# Patient Record
Sex: Female | Born: 1990 | Race: White | Hispanic: No | Marital: Married | Smoking: Never smoker
Health system: Southern US, Community
[De-identification: ages and names within clinical notes are randomized; demographics above are authoritative.]

## PROBLEM LIST (undated history)

## (undated) ENCOUNTER — Inpatient Hospital Stay (HOSPITAL_COMMUNITY): Payer: Self-pay

## (undated) DIAGNOSIS — E669 Obesity, unspecified: Secondary | ICD-10-CM

## (undated) DIAGNOSIS — R1013 Epigastric pain: Secondary | ICD-10-CM

## (undated) DIAGNOSIS — Z789 Other specified health status: Secondary | ICD-10-CM

## (undated) HISTORY — DX: Obesity, unspecified: E66.9

## (undated) HISTORY — PX: OTHER SURGICAL HISTORY: SHX169

---
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
## 2009-10-29 ENCOUNTER — Telehealth: Payer: Self-pay | Admitting: *Deleted

## 2010-04-16 NOTE — Progress Notes (Signed)
Summary: triage  Phone Note Call from Patient Call back at [phone]   Caller: Patient Summary of Call: Pt has sore throat and wondering if she can be seen today? Initial call taken by: Clydell Hakim,  October 29, 2009 10:10 AM  Follow-up for Phone Call        sore throat x 3-4 days. taking ibu & sudafed. gargling at time. ibu does help. no appts left for today. does not want to wait until tomorrow.  states there is a UC near her in [place] that she can go to. told her that would be ok. she will go there Follow-up by: Golden Circle RN,  October 29, 2009 10:26 AM

## 2010-05-08 ENCOUNTER — Encounter: Payer: Self-pay | Admitting: *Deleted

## 2011-08-26 ENCOUNTER — Ambulatory Visit (INDEPENDENT_AMBULATORY_CARE_PROVIDER_SITE_OTHER): Payer: BC Managed Care – PPO | Admitting: Family Medicine

## 2011-08-26 ENCOUNTER — Encounter: Payer: Self-pay | Admitting: Family Medicine

## 2011-08-26 VITALS — Ht 64.25 in | Wt 235.7 lb

## 2011-08-26 NOTE — Progress Notes (Signed)
Medical Nutrition Therapy:  Appt start time: 1630 end time:  1730.  Assessment:  Primary concerns today: Weight management.  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    Usual eating pattern includes 3 meals and 2 snacks per day, although most food is consumed late in the day. Usual physical activity includes none.  Mychal started working out with a Systems analyst, but the trainer quit following an injury, so she is floundering somewhat in terms of exercise.  Right now she would like to focus first on better food choices.  Weight Watchers has worked for [facility] in the past; she lost ~30 lb, but it became too expensive to continue, and although she learned to count points, Ellene did not learn what makes a healthy diet.    Everyday foods include 20 oz diet green tea, fruit.  Avoided foods include tomatoes (disliked).   24-hr recall suggests intake of <1500 kcal: B (8 AM)-   1 c Cocoa Pebbles , 1/2 c 1% milk  Snk-    water L (12- PM)-  [place] restaurant:  3/4 c rice, 1 c chx, mushrooms, & onions in chili sauce, 12 oz Sprite Snk ( PM)-  none D (7:30 PM)-  4 bbq ribs, 1/4 c potato salad (takeout) Snk ( PM)-  1 large [employer] chocolate chip muffin, water  Progress Towards Goal(s):  In progress.   Nutritional Diagnosis:  NB-2.1 Physical inactivity As related to poor motivation.  As evidenced by no regular exercise.    Intervention:  Nutrition Education.  Monitoring/Evaluation:  Dietary intake, exercise, and body weight in 1 month.

## 2011-08-26 NOTE — Patient Instructions (Addendum)
-   Wt management principles:  1. Fairly even food distribution thru the day (3 meals & 1-2 snacks/day; no more than 5 hrs between eating).    2. Obtain more low-calorie-dense foods (fruits and especially vegetables).    3. Solid calories (vs. liquid) help your brain to feel more satisfied.   4. Another tenet of appetite management:  Limit refined carb's (sweets & white flour products), and especially before lunch.    5. Protein with each meal helps to stabilize blood sugar (thereby keeping appetite in control).  May be especially important for breakfast.    Protein:  Meat, fish, eggs, poultry, beans (also have carb, mostly fiber), dairy foods, soy foods.    6. Daily physical activity, which includes working out (?5 X wk?), and just as important, moving throughout your day.     - Goals:   1. Protein with each meal.  2. Walk at least 30 min 5 X wk.   3. Limit sweets to no more than 3 X wk.   - Complete goals sheet provided today.  - Google "Lafonda Mosses and Sugar," and watch videos. - Write the reasons you want to lose weight.  Bring to follow-up appt in 3 weeks.   - AT FOLLOW-UP, WE'LL TALK MORE ABOUT INCORPORATING MORE VEG'S TO MEALS.

## 2011-09-16 ENCOUNTER — Ambulatory Visit: Payer: BC Managed Care – PPO | Admitting: Family Medicine

## 2012-01-28 ENCOUNTER — Ambulatory Visit: Payer: BC Managed Care – PPO | Admitting: Family Medicine

## 2012-01-29 ENCOUNTER — Ambulatory Visit: Payer: BC Managed Care – PPO | Admitting: Family Medicine

## 2012-02-11 ENCOUNTER — Ambulatory Visit (INDEPENDENT_AMBULATORY_CARE_PROVIDER_SITE_OTHER): Payer: BC Managed Care – PPO | Admitting: Family Medicine

## 2012-02-11 ENCOUNTER — Other Ambulatory Visit (HOSPITAL_COMMUNITY)
Admission: RE | Admit: 2012-02-11 | Discharge: 2012-02-11 | Disposition: A | Discharge status: Home / Self Care | Payer: BC Managed Care – PPO | Source: Ambulatory Visit | Attending: Family Medicine | Admitting: Family Medicine

## 2012-02-11 ENCOUNTER — Encounter: Payer: Self-pay | Admitting: Family Medicine

## 2012-02-11 VITALS — BP 133/83 | HR 93 | Temp 98.4°F | Ht 64.5 in | Wt 232.0 lb

## 2012-02-11 DIAGNOSIS — Z01419 Encounter for gynecological examination (general) (routine) without abnormal findings: Secondary | ICD-10-CM

## 2012-02-11 DIAGNOSIS — Z309 Encounter for contraceptive management, unspecified: Secondary | ICD-10-CM | POA: Insufficient documentation

## 2012-02-11 DIAGNOSIS — Z23 Encounter for immunization: Secondary | ICD-10-CM

## 2012-02-11 DIAGNOSIS — Z113 Encounter for screening for infections with a predominantly sexual mode of transmission: Secondary | ICD-10-CM | POA: Insufficient documentation

## 2012-02-11 DIAGNOSIS — Z1151 Encounter for screening for human papillomavirus (HPV): Secondary | ICD-10-CM | POA: Insufficient documentation

## 2012-02-11 DIAGNOSIS — Z124 Encounter for screening for malignant neoplasm of cervix: Secondary | ICD-10-CM

## 2012-02-11 NOTE — Assessment & Plan Note (Signed)
Discussed options in detail. Would like to avoid depo due to weight gain.  Interested in [place].  Will research and let me know.  If she changes her mind, will be happy to prescribe OCP or nuvaring.

## 2012-02-11 NOTE — Patient Instructions (Addendum)
Recommendations: Flu shot TDAP Gardasil vaccination series Fasting labwork- cholesterol, blood sugar  Return for Mirena IUD [email or web address]

## 2012-02-11 NOTE — Progress Notes (Signed)
  Subjective:    Patient ID: Karen Mcintyre, female    DOB: [birth year], 21 y.o.   MRN: [id number]  HPInew patient here to establish primary care  Annual Gynecological Exam  G0P0 Wt Readings from Last 3 Encounters:  02/11/12 232 lb (105.235 kg)  08/26/11 235 lb 11.2 oz (106.913 kg)  12/23/06 187 lb 4.8 oz (84.959 kg) (96.68%*)   * Growth percentiles are based on CDC 2-20 Years data.   Last period: 01/29/2012 Regular periods: yes Heavy bleeding: no  Sexually active: yes Birth control or hormonal therapy: has been on depo.  Hx of STD: Patient desires STD screening Dyspareunia: No Vaginal discharge: No Dysuria: NO  Last mammogram:No Breast mass or concerns: No Last Pap: None History of abnormal pap: No  FH of breast, uterine, ovarian, colon cancer: No   Patient Information Form: Screening and ROS  AUDIT-C Score: 0 Do you feel safe in relationships? yes PHQ-2:negative  Review of Symptoms  General:  Negative for nexplained weight loss, fever Skin: Negative for new or changing mole, sore that won't heal HEENT: Negative for trouble hearing, trouble seeing, ringing in ears, mouth sores, hoarseness, change in voice, dysphagia. CV:  Negative for chest pain, dyspnea, edema, palpitations Resp: Negative for cough, dyspnea, hemoptysis GI: Negative for nausea, vomiting, diarrhea, constipation, abdominal pain, melena, hematochezia. GU: Negative for dysuria, incontinence, urinary hesitance, hematuria, vaginal or penile discharge, polyuria, sexual difficulty, lumps in testicle or breasts MSK: Negative for muscle cramps or aches, joint pain or swelling Neuro: Negative for headaches, weakness, numbness, dizziness, passing out/fainting Psych: Negative for depression, anxiety, memory problems  Positive for nausea  I have reviewed patient's  PMH, FH, and Social history and Medications as related to this visit.  Review of Systems See HPi    Objective:   Physical Exam GEN: Alert &  Oriented, No acute distress CV:  Regular Rate & Rhythm, no murmur Respiratory:  Normal work of breathing, CTAB Abd:  + BS, soft, no tenderness to palpation Ext: no pre-tibial edema Pelvic Exam:        External: normal female genitalia without lesions or masses        Vagina: normal without lesions or masses        Cervix: normal without lesions or masses        Adnexa: normal bimanual exam without masses or fullness        Uterus: normal by palpation        Pap smear: performed         Assessment & Plan:  Given flu and tdap today.  No gardasil documented.  Patient will check to see if she has record of this series being given as state vaccination record does not appear complete.

## 2012-02-23 ENCOUNTER — Encounter: Payer: Self-pay | Admitting: Family Medicine

## 2012-02-23 DIAGNOSIS — IMO0001 Reserved for inherently not codable concepts without codable children: Secondary | ICD-10-CM | POA: Insufficient documentation

## 2012-02-23 DIAGNOSIS — R896 Abnormal cytological findings in specimens from other organs, systems and tissues: Secondary | ICD-10-CM

## 2012-06-08 ENCOUNTER — Ambulatory Visit (INDEPENDENT_AMBULATORY_CARE_PROVIDER_SITE_OTHER): Payer: BC Managed Care – PPO | Admitting: Internal Medicine

## 2012-06-08 VITALS — BP 122/96 | HR 62 | Temp 98.5°F | Resp 16 | Ht 64.25 in | Wt 237.0 lb

## 2012-06-08 DIAGNOSIS — R059 Cough, unspecified: Secondary | ICD-10-CM

## 2012-06-08 DIAGNOSIS — R05 Cough: Secondary | ICD-10-CM

## 2012-06-08 DIAGNOSIS — J029 Acute pharyngitis, unspecified: Secondary | ICD-10-CM

## 2012-06-08 LAB — POCT RAPID STREP A (OFFICE): Rapid Strep A Screen: NEGATIVE

## 2012-06-08 MED ORDER — HYDROCODONE-ACETAMINOPHEN 7.5-325 MG/15ML PO SOLN: 5.0000 mL | Freq: Four times a day (QID) | ORAL | Status: DC | PRN

## 2012-06-08 MED ORDER — AZITHROMYCIN 250 MG PO TABS: ORAL_TABLET | ORAL | Status: DC

## 2012-06-08 NOTE — Progress Notes (Signed)
  Subjective:    Patient ID: Karen Mcintyre, female    DOB: 1991-01-28, 22 y.o.   MRN: [id number]  HPI 2days of st and congestion mild cough. No sob,cp, blood. Low grade fever   Review of Systems See list    Objective:   Physical Exam  Vitals reviewed. Constitutional: She is oriented to person, place, and time. She appears well-nourished. No distress.  HENT:  Right Ear: External ear normal.  Left Ear: External ear normal.  Mouth/Throat: Oropharyngeal exudate present.  Eyes: EOM are normal.  Cardiovascular: Normal rate, regular rhythm and normal heart sounds.   Pulmonary/Chest: Effort normal. She has wheezes.  Neurological: She is alert and oriented to person, place, and time. Coordination normal.  Skin: No rash noted.  Psychiatric: She has a normal mood and affect.   RST Results for orders placed in visit on 06/08/12  POCT RAPID STREP A (OFFICE)      Result Value Range   Rapid Strep A Screen Negative  Negative         Assessment & Plan:  Tonsillitis/cough

## 2012-06-08 NOTE — Patient Instructions (Addendum)
Tonsillitis Tonsils are lumps of lymphoid tissues at the back of the throat. Each tonsil has 20 crevices (crypts). Tonsils help fight nose and throat infections and keep infection from spreading to other parts of the body for the first 18 months of life. Tonsillitis is an infection of the throat that causes the tonsils to become red, tender, and swollen. CAUSES Sudden and, if treated, temporary (acute) tonsillitis is usually caused by infection with streptococcal bacteria. Long lasting (chronic) tonsillitis occurs when the crypts of the tonsils become filled with pieces of food and bacteria, which makes it easy for the tonsils to become constantly infected. SYMPTOMS  Symptoms of tonsillitis include:  A sore throat.  White patches on the tonsils.  Fever.  Tiredness. DIAGNOSIS Tonsillitis can be diagnosed through a physical exam. Diagnosis can be confirmed with the results of lab tests, including a throat culture. TREATMENT  The goals of tonsillitis treatment include the reduction of the severity and duration of symptoms, prevention of associated conditions, and prevention of disease transmission. Tonsillitis caused by bacteria can be treated with antibiotics. Usually, treatment with antibiotics is started before the cause of the tonsillitis is known. However, if it is determined that the cause is not bacterial, antibiotics will not treat the tonsillitis. If attacks of tonsillitis are severe and frequent, your caregiver may recommend surgery to remove the tonsils (tonsillectomy). HOME CARE INSTRUCTIONS   Rest as much as possible and get plenty of sleep.  Drink plenty of fluids. While the throat is very sore, eat soft foods or liquids, such as sherbet, soups, or instant breakfast drinks.  Eat frozen ice pops.  Older children and adults may gargle with a warm or cold liquid to help soothe the throat. Mix 1 teaspoon of salt in 1 cup of water.  Other family members who also develop a sore  throat or fever should have a medical exam or throat culture.  Only take over-the-counter or prescription medicines for pain, discomfort, or fever as directed by your caregiver.  If you are given antibiotics, take them as directed. Finish them even if you start to feel better. SEEK MEDICAL CARE IF:   Your baby is older than 3 months with a rectal temperature of 100.5 F (38.1 C) or higher for more than 1 day.  Large, tender lumps develop in your neck.  A rash develops.  Green, yellow-brown, or bloody substance is coughed up.  You are unable to swallow liquids or food for 24 hours.  Your child is unable to swallow food or liquids for 12 hours. SEEK IMMEDIATE MEDICAL CARE IF:   You develop any new symptoms such as vomiting, severe headache, stiff neck, chest pain, or trouble breathing or swallowing.  You have severe throat pain along with drooling or voice changes.  You have severe pain, unrelieved with recommended medications.  You are unable to fully open the mouth.  You develop redness, swelling, or severe pain anywhere in the neck.  You have a fever.  Your baby is older than 3 months with a rectal temperature of 102 F (38.9 C) or higher.  Your baby is 3 months old or younger with a rectal temperature of 100.4 F (38 C) or higher. MAKE SURE YOU:   Understand these instructions.  Will watch your condition.  Will get help right away if you are not doing well or get worse. Document Released: 12/11/2004 Document Revised: 05/26/2011 Document Reviewed: 05/09/2010 ExitCare Patient Information 2013 [employer].  

## 2012-07-08 DIAGNOSIS — Y92410 Unspecified street and highway as the place of occurrence of the external cause: Secondary | ICD-10-CM | POA: Insufficient documentation

## 2012-07-08 DIAGNOSIS — S6990XA Unspecified injury of unspecified wrist, hand and finger(s), initial encounter: Secondary | ICD-10-CM | POA: Insufficient documentation

## 2012-07-08 DIAGNOSIS — S59909A Unspecified injury of unspecified elbow, initial encounter: Secondary | ICD-10-CM | POA: Insufficient documentation

## 2012-07-08 DIAGNOSIS — Y9389 Activity, other specified: Secondary | ICD-10-CM | POA: Insufficient documentation

## 2012-07-08 DIAGNOSIS — S20219A Contusion of unspecified front wall of thorax, initial encounter: Secondary | ICD-10-CM | POA: Insufficient documentation

## 2012-07-08 DIAGNOSIS — S301XXA Contusion of abdominal wall, initial encounter: Secondary | ICD-10-CM | POA: Insufficient documentation
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
## 2012-07-09 ENCOUNTER — Emergency Department (HOSPITAL_BASED_OUTPATIENT_CLINIC_OR_DEPARTMENT_OTHER)
Admission: EM | Admit: 2012-07-09 | Discharge: 2012-07-09 | Disposition: A | Discharge status: Home / Self Care | Payer: BC Managed Care – PPO | Attending: Emergency Medicine | Admitting: Emergency Medicine

## 2012-07-09 ENCOUNTER — Encounter (HOSPITAL_BASED_OUTPATIENT_CLINIC_OR_DEPARTMENT_OTHER): Payer: Self-pay | Admitting: *Deleted

## 2012-07-09 MED ORDER — HYDROCODONE-ACETAMINOPHEN 5-325 MG PO TABS: 1.0000 | ORAL_TABLET | Freq: Four times a day (QID) | ORAL | Status: DC | PRN
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
## 2013-04-14 ENCOUNTER — Other Ambulatory Visit (INDEPENDENT_AMBULATORY_CARE_PROVIDER_SITE_OTHER): Payer: Self-pay | Admitting: General Surgery

## 2013-04-14 ENCOUNTER — Ambulatory Visit (INDEPENDENT_AMBULATORY_CARE_PROVIDER_SITE_OTHER): Payer: BC Managed Care – PPO | Admitting: General Surgery

## 2013-04-14 ENCOUNTER — Encounter (INDEPENDENT_AMBULATORY_CARE_PROVIDER_SITE_OTHER): Payer: Self-pay | Admitting: General Surgery

## 2013-04-14 VITALS — BP 128/80 | HR 83 | Temp 98.8°F | Resp 18 | Ht 63.5 in | Wt 258.0 lb

## 2013-04-14 DIAGNOSIS — Z6841 Body Mass Index (BMI) 40.0 and over, adult: Secondary | ICD-10-CM

## 2013-04-14 DIAGNOSIS — M25569 Pain in unspecified knee: Secondary | ICD-10-CM

## 2013-04-14 DIAGNOSIS — M545 Low back pain, unspecified: Secondary | ICD-10-CM

## 2013-04-14 NOTE — Patient Instructions (Signed)
We will start the pre-qualification evaluation. Please call me if you have any questions during the process. Please watch the EMMI video on gastric bypass.

## 2013-04-14 NOTE — Progress Notes (Signed)
Patient ID: Karen Mcintyre, female   DOB: 02-16-1991, 23 y.o.   MRN: [id number]  Chief Complaint  Patient presents with  . Bariatric Pre-op    RNY    HPI Karen Mcintyre is a 23 y.o. female.   HPI 23 yo morbidly obese WF referred by Dr Robert Bellow for evaluation of weight loss surgery.  The patient states that she has struggled with her weight ever since puberty. Despite numerous sustained attempts for weight loss she has been unsuccessful. She has tried Vickey Sages, [employer], physician directed weight loss, low calorie diet, Slim fast, as well as phentermine all without any long-term success. She would lose some weight but regained all the weight back plus some additional weight. She works as a Haematologist. She is interested in the gastric bypass because of the research she has done. She is not interested in the laparoscopic adjustable gastric band because of the frequency of followup visits as well as for insurance reasons.  She is accompanied by her mother who underwent a laparoscopic sleeve gastrectomy by me in the fall of 2014 History reviewed. No pertinent past medical history.  History reviewed. No pertinent past surgical history.  Family History  Problem Relation Age of Onset  . Hyperlipidemia Mother   . Alcohol abuse Father   . Cancer Paternal Grandmother     lung cancer    Social History History  Substance Use Topics  . Smoking status: Never Smoker   . Smokeless tobacco: Never Used  . Alcohol Use: No    Allergies  Allergen Reactions  . Diphenhydramine Hcl     REACTION: urticaria (hives)  . Nitrofurantoin     REACTION: unspecified    Current Outpatient Prescriptions  Medication Sig Dispense Refill  . azithromycin (ZITHROMAX) 250 MG tablet Use as directed  6 tablet  0  . HYDROcodone-acetaminophen (HYCET) 7.5-325 mg/15 ml solution Take 5 mLs by mouth every 6 (six) hours as needed for pain (or cough).  240 mL  0  . HYDROcodone-acetaminophen (NORCO/VICODIN) 5-325 MG per  tablet Take 1-2 tablets by mouth every 6 (six) hours as needed for pain.  20 tablet  0   No current facility-administered medications for this visit.    Review of Systems Review of Systems  Constitutional: Negative for fever, activity change, appetite change and unexpected weight change.  HENT: Negative for nosebleeds and trouble swallowing.   Eyes: Negative for photophobia and visual disturbance.  Respiratory: Negative for chest tightness and shortness of breath.   Cardiovascular: Negative for chest pain and leg swelling.       Denies CP, SOB, orthopnea, PND, DOE  Gastrointestinal: Negative for nausea, vomiting, abdominal pain, diarrhea and constipation.       Denies reflux symptoms; takes probiotic daily  Genitourinary: Negative for dysuria and difficulty urinating.       Periods usually 3 days, regular flow; past couple of periods spotting then several days of heavy flow. Currently no birth control. No pregnancies  Musculoskeletal: Positive for back pain. Negative for arthralgias.       Low back pain; by end of day has b/l knee pain  Skin: Negative for pallor and rash.  Neurological: Negative for dizziness, seizures, facial asymmetry and numbness.          Hematological: Negative for adenopathy. Does not bruise/bleed easily.  Psychiatric/Behavioral: Negative for behavioral problems and agitation.    Blood pressure 128/80, pulse 83, temperature 98.8 F (37.1 C), resp. rate 18, height 5' 3.5" (1.613 m),  weight 258 lb (117.028 kg).  Physical Exam Physical Exam  Vitals reviewed. Constitutional: She is oriented to person, place, and time. She appears well-developed and well-nourished. No distress.  Morbid obesity - evenly distributed  HENT:  Head: Normocephalic and atraumatic.  Right Ear: External ear normal.  Left Ear: External ear normal.  Eyes: Conjunctivae are normal. No scleral icterus.  Neck: Normal range of motion. Neck supple. No tracheal deviation present. No  thyromegaly present.  Cardiovascular: Normal rate and normal heart sounds.   Pulmonary/Chest: Effort normal and breath sounds normal. No stridor. No respiratory distress. She has no wheezes.  Abdominal: Soft. Bowel sounds are normal. She exhibits no distension. There is no tenderness. There is no rebound and no guarding.  Musculoskeletal: She exhibits no edema and no tenderness.  Lymphadenopathy:    She has no cervical adenopathy.  Neurological: She is alert and oriented to person, place, and time. She exhibits normal muscle tone.  Skin: Skin is warm and dry. No rash noted. She is not diaphoretic. No erythema.  Psychiatric: She has a normal mood and affect. Her behavior is normal. Judgment and thought content normal.    Data Reviewed Dr Perrin MalteseGuest note  Assessment    Morbid obesity BMI 44.99 Low back pain B/l Knee pain     Plan    The patient meets weight loss surgery criteria. I think the patient would be an acceptable candidate for Laparoscopic Roux-en-Y Gastric bypass.   We discussed laparoscopic Roux-en-Y gastric bypass. We discussed the preoperative, operative and postoperative process. Using diagrams, I explained the surgery in detail including the performance of an EGD near the end of the surgery and an Upper GI swallow study on POD 1. We discussed the typical hospital course including a 2-3 day stay baring any complications.   The patient was given educational material. I quoted the patient that they can expect to lose 50-70% of their excess weight with the gastric bypass. We did discuss the possibility of weight regain several years after the procedure.  We discussed the risk and benefits of surgery including but not limited to anesthesia risk, bleeding, infection, anastomotic edema requiring a few additional days in the hospital, postop nausea, possible conversion to open procedure, blood clot formation, anastomotic leak, anastomotic stricture, ulcer formation, death, respiratory  complications, intestinal blockage, internal hernia, gallstone formation, vitamin and nutritional deficiencies, hair loss, weight regain injury to surrounding structures, failure to lose weight and mood changes.  We discussed that before and after surgery that there would be an alteration in their diet. I explained that we have put them on a diet 2 weeks before surgery. I also explained that they would be on a liquid diet for 2 weeks after surgery. We discussed that they would have to avoid certain foods such as sugar after surgery. We discussed the importance of physical activity as well as compliance with our dietary and supplement recommendations and routine follow-up.  I explained to the patient that we will start our evaluation process which includes labs, Upper GI to evaluate stomach and swallowing anatomy, nutritionist consultation, psychiatrist consultation, EKG, CXR, abdominal ultrasound. She was also given access to watch EMMI video on RYGB.  Mary SellaEric M. [name], MD, FACS General, Bariatric, & Minimally Invasive Surgery [facility], [name]          [facility] Associates LLCWILSON,Hutchinson Isenberg M 04/14/2013, 11:42 AM

## 2013-04-18 ENCOUNTER — Other Ambulatory Visit (INDEPENDENT_AMBULATORY_CARE_PROVIDER_SITE_OTHER): Payer: Self-pay

## 2013-04-19 ENCOUNTER — Ambulatory Visit (INDEPENDENT_AMBULATORY_CARE_PROVIDER_SITE_OTHER): Payer: BC Managed Care – PPO | Admitting: Family Medicine

## 2013-04-19 ENCOUNTER — Encounter: Payer: Self-pay | Admitting: Family Medicine

## 2013-04-19 VITALS — BP 122/78 | HR 92 | Temp 98.3°F | Ht 63.0 in | Wt 258.6 lb

## 2013-04-19 NOTE — Patient Instructions (Signed)
It was nice seeing Patent attorneyyou Velvet, I will work on you paper work for your surgery and fax it to Personal assistantthe surgeon. You do have abnormal PAP which requires follow up, please schedule repeat PAP in Nov of this year.

## 2013-04-19 NOTE — Progress Notes (Signed)
Subjective:     Patient ID: Karen Mcintyre, female   DOB: 08/21/1990, 23 y.o.   MRN: [id number]  HPI Obesity: Here to follow up with her weight,she has had issues with this since puberty,she has tried different options to lose weight with no success,this has become bothersome to her, at work her leg swells up when she sits for long,she also feel so tired most time during the day.She was recently evaluated for gastric bypass and would like to complete form for medical necessity. Abnormal PAP: She had ASCUS in 2013,here for follow up. Denies any other GU concern. No current outpatient prescriptions on file prior to visit.   No current facility-administered medications on file prior to visit.   Past Medical History  Diagnosis Date  . Obesity       Review of Systems  Constitutional: Positive for unexpected weight change.  Respiratory: Negative.   Cardiovascular: Negative.   Gastrointestinal: Negative.   Genitourinary: Negative.   All other systems reviewed and are negative.   Filed Vitals:   04/19/13 0845  BP: 122/78  Pulse: 92  Temp: 98.3 F (36.8 C)  TempSrc: Oral  Height: 5\' 3"  (1.6 m)  Weight: 258 lb 9.6 oz (117.3 kg)       Objective:   Physical Exam  Nursing note and vitals reviewed. Constitutional: She appears well-developed. No distress.  Obese  Cardiovascular: Normal rate, regular rhythm, normal heart sounds and intact distal pulses.   No murmur heard. Pulmonary/Chest: Effort normal and breath sounds normal. No respiratory distress. She has no wheezes.  Abdominal: Soft. Bowel sounds are normal. She exhibits no distension and no mass. There is no tenderness.  Musculoskeletal: Normal range of motion. She exhibits no edema.  Neurological: She is alert.       Assessment:     Obesity Abnormal PAP: ASCUS     Plan:     Check problem list

## 2013-04-19 NOTE — Assessment & Plan Note (Signed)
Patient had neg HPV hence can return to routine screening. Last abnormal PAP was in nov 2013. Plan to recheck in Nov 2015. Patient instructed to schedule f/u in 9 months. She agreed with plan and verbalized understanding.

## 2013-04-19 NOTE — Assessment & Plan Note (Signed)
Patient has not been able to lose weight despite trying. This is affecting her daily activities. Patient qualifies for bariatric surgery with her BMI and symptoms. I completed her paper work and placed in front office to be mailed to her and also faxed to [facility].

## 2013-05-03 ENCOUNTER — Ambulatory Visit (INDEPENDENT_AMBULATORY_CARE_PROVIDER_SITE_OTHER): Payer: BC Managed Care – PPO | Admitting: Internal Medicine

## 2013-05-03 VITALS — BP 108/80 | HR 66 | Temp 98.2°F | Resp 16 | Ht 63.5 in | Wt 258.0 lb

## 2013-05-03 DIAGNOSIS — H109 Unspecified conjunctivitis: Secondary | ICD-10-CM

## 2013-05-03 DIAGNOSIS — H1089 Other conjunctivitis: Secondary | ICD-10-CM

## 2013-05-03 MED ORDER — OFLOXACIN 0.3 % OP SOLN: 1.0000 [drp] | OPHTHALMIC | Status: DC

## 2013-05-03 NOTE — Patient Instructions (Signed)

## 2013-05-03 NOTE — Progress Notes (Signed)
   Subjective:    Patient ID: Karen Mcintyre, female    DOB: 09/05/1990, 23 y.o.   MRN: [id number]  HPI Patient presents here with 2-3 days of eye drainage.  Has had watery and itchy eyes.  Woke up with goopy eyes. Works at a [employer]. No other resp sxs.    Review of Systems     Objective:   Physical Exam  Constitutional: She is oriented to person, place, and time. She appears well-developed and well-nourished.  HENT:  Mouth/Throat: Oropharynx is clear and moist.  Eyes: EOM are normal. Pupils are equal, round, and reactive to light. Right eye exhibits no discharge and no exudate. Left eye exhibits discharge and exudate. Left eye exhibits no chemosis. No foreign body present in the left eye. Right conjunctiva is injected. Left conjunctiva is injected. No scleral icterus.  Neck: Normal range of motion.  Cardiovascular: Normal rate.   Pulmonary/Chest: Effort normal.  Musculoskeletal: Normal range of motion.  Lymphadenopathy:    She has no cervical adenopathy.  Neurological: She is alert and oriented to person, place, and time. She exhibits normal muscle tone. Coordination normal.  Psychiatric: She has a normal mood and affect.          Assessment & Plan:  Conjunctivitis Ocuflox gtts

## 2013-05-06 ENCOUNTER — Ambulatory Visit (HOSPITAL_COMMUNITY)
Admission: RE | Admit: 2013-05-06 | Discharge: 2013-05-06 | Disposition: A | Discharge status: Home / Self Care | Payer: BC Managed Care – PPO | Source: Ambulatory Visit | Attending: General Surgery | Admitting: General Surgery

## 2013-05-06 ENCOUNTER — Other Ambulatory Visit: Payer: Self-pay

## 2013-05-06 DIAGNOSIS — M25569 Pain in unspecified knee: Secondary | ICD-10-CM | POA: Insufficient documentation

## 2013-05-06 DIAGNOSIS — M545 Low back pain, unspecified: Secondary | ICD-10-CM | POA: Insufficient documentation

## 2013-05-06 DIAGNOSIS — K449 Diaphragmatic hernia without obstruction or gangrene: Secondary | ICD-10-CM | POA: Insufficient documentation

## 2013-05-06 DIAGNOSIS — Z6841 Body Mass Index (BMI) 40.0 and over, adult: Secondary | ICD-10-CM | POA: Insufficient documentation

## 2013-05-19 ENCOUNTER — Encounter: Payer: BC Managed Care – PPO | Attending: General Surgery | Admitting: Dietician

## 2013-05-19 ENCOUNTER — Encounter: Payer: Self-pay | Admitting: Dietician

## 2013-05-19 VITALS — Ht 63.5 in | Wt 261.1 lb

## 2013-05-19 DIAGNOSIS — Z713 Dietary counseling and surveillance: Secondary | ICD-10-CM | POA: Insufficient documentation

## 2013-05-19 LAB — CBC
HCT: 38.9 % (ref 36.0–46.0)
Hemoglobin: 13.4 g/dL (ref 12.0–15.0)
MCH: 29.3 pg (ref 26.0–34.0)
MCHC: 34.4 g/dL (ref 30.0–36.0)
MCV: 85.1 fL (ref 78.0–100.0)
PLATELETS: 369 10*3/uL (ref 150–400)
RBC: 4.57 MIL/uL (ref 3.87–5.11)
RDW: 13 % (ref 11.5–15.5)
WBC: 8.6 10*3/uL (ref 4.0–10.5)

## 2013-05-19 NOTE — Progress Notes (Signed)
  Pre-Op Assessment Visit:  Pre-Operative RYGB Surgery  Medical Nutrition Therapy:  Appt start time: 1100   End time:  1130.  Patient was seen on 05/19/2013 for Pre-Operative RYGB Nutrition Assessment. Assessment and [facility] on 05/19/2013.   Preferred Learning Style:  No preference indicated   Learning Readiness:   Ready  Handouts given during visit include:  Pre-Op Goals Bariatric Surgery Protein Shakes Bariatric Fast Food Guide  Teaching Method Utilized: Visual Auditory Hands on  Barriers to learning/adherence to lifestyle change: none  Demonstrated degree of understanding via:  Teach Back   Patient to call the [facility] to enroll in Pre-Op and Post-Op Nutrition Education when surgery date is scheduled.

## 2013-05-19 NOTE — Patient Instructions (Signed)
Patient to call the [facility] to enroll in Pre-Op and Post-Op Nutrition Education when surgery date is scheduled. 

## 2013-05-20 LAB — T4, FREE: Free T4: 1.03 ng/dL (ref 0.80–1.80)

## 2013-05-20 LAB — HCG, SERUM, QUALITATIVE: Preg, Serum: NEGATIVE

## 2013-05-20 LAB — TSH: TSH: 3.076 u[IU]/mL (ref 0.350–4.500)

## 2013-06-08 ENCOUNTER — Other Ambulatory Visit (INDEPENDENT_AMBULATORY_CARE_PROVIDER_SITE_OTHER): Payer: Self-pay | Admitting: General Surgery

## 2013-06-17 LAB — LIPID PANEL
CHOL/HDL RATIO: 3.7 ratio
CHOLESTEROL: 173 mg/dL (ref 0–200)
HDL: 47 mg/dL (ref >39)
LDL Cholesterol: 101 mg/dL — ABNORMAL HIGH (ref 0–99)
Triglycerides: 126 mg/dL (ref <150)
VLDL: 25 mg/dL (ref 0–40)

## 2013-06-22 ENCOUNTER — Other Ambulatory Visit (INDEPENDENT_AMBULATORY_CARE_PROVIDER_SITE_OTHER): Payer: Self-pay | Admitting: General Surgery

## 2013-06-27 ENCOUNTER — Encounter: Payer: BC Managed Care – PPO | Attending: General Surgery

## 2013-06-27 VITALS — Ht 63.5 in | Wt 263.5 lb

## 2013-06-27 DIAGNOSIS — Z713 Dietary counseling and surveillance: Secondary | ICD-10-CM | POA: Insufficient documentation

## 2013-06-29 NOTE — Progress Notes (Signed)
  Pre-Operative Nutrition Class:  Appt start time: 1102   End time:  1830.  Patient was seen on 06/27/2013 for Pre-Operative Bariatric Surgery Education at the [facility].   Surgery date: 08/01/13 Surgery type: RYGB Start weight at [facility]: 261 on 05/19/13 Weight today: 263.5 lbs  TANITA  BODY COMP RESULTS  06/27/13   BMI (kg/m^2) 45.9   Fat Mass (lbs) 134   Fat Free Mass (lbs) 129.5   Total Body Water (lbs) 95   Samples given per MNT protocol. Patient educated on appropriate usage:  Bariactiv Multivitamin (Qty 1) Lot #: [id number] Exp: 07/2014  Bariatric Advantage Calcium Citrate (cherry - Qty 1) Lot #: [id number] Exp: 12/2013  Premier protein shake (chocolate - Qty 1) Lot #: [id number] Exp: 03/2014  Renee Pain Protein Powder (unflavored - Qty 1) Lot #: [id number] Exp: 06/2014   The following the learning objectives were met by the patient during this course:  Identify Pre-Op Dietary Goals and will begin 2 weeks pre-operatively  Identify appropriate sources of fluids and proteins   State protein recommendations and appropriate sources pre and post-operatively  Identify Post-Operative Dietary Goals and will follow for 2 weeks post-operatively  Identify appropriate multivitamin and calcium sources  Describe the need for physical activity post-operatively and will follow MD recommendations  State when to call healthcare provider regarding medication questions or post-operative complications  Handouts given during class include:  Pre-Op Bariatric Surgery Diet Handout  Protein Shake Handout  Post-Op Bariatric Surgery Nutrition Handout  BELT Program Information Flyer  Support Group Information Flyer  [facility] Bariatric Supplements Price List  Follow-Up Plan: Patient will follow-up at [facility] 2 weeks post operatively for diet advancement per MD.

## 2013-07-11 ENCOUNTER — Other Ambulatory Visit (INDEPENDENT_AMBULATORY_CARE_PROVIDER_SITE_OTHER): Payer: Self-pay | Admitting: General Surgery

## 2013-07-12 ENCOUNTER — Other Ambulatory Visit (INDEPENDENT_AMBULATORY_CARE_PROVIDER_SITE_OTHER): Payer: Self-pay | Admitting: General Surgery

## 2013-07-12 LAB — COMPREHENSIVE METABOLIC PANEL
ALT: 15 U/L (ref 0–35)
AST: 14 U/L (ref 0–37)
Albumin: 4.6 g/dL (ref 3.5–5.2)
Alkaline Phosphatase: 67 U/L (ref 39–117)
BILIRUBIN TOTAL: 0.3 mg/dL (ref 0.2–1.2)
BUN: 12 mg/dL (ref 6–23)
CO2: 25 mEq/L (ref 19–32)
CREATININE: 0.63 mg/dL (ref 0.50–1.10)
Calcium: 8.8 mg/dL (ref 8.4–10.5)
Chloride: 106 mEq/L (ref 96–112)
Glucose, Bld: 92 mg/dL (ref 70–99)
Potassium: 4 mEq/L (ref 3.5–5.3)
SODIUM: 143 meq/L (ref 135–145)
Total Protein: 7.2 g/dL (ref 6.0–8.3)

## 2013-07-20 NOTE — Patient Instructions (Addendum)
Your procedure is scheduled on:  08/01/13 MONDAY  Report to [facility] at  08:20     AM.  Call this number if you have problems the morning of surgery: [phone]     BOWEL PREP AS PER OFFICE   Do not TAKE ANYTHING BY MOUTH After Midnight.SUNDAY NIGHT   Take these medicines the morning of surgery with A SIP OF WATER:NONE   .  Contacts, dentures or partial plates, or metal hairpins  can not be worn to surgery. Your family will be responsible for glasses, dentures, hearing aides while you are in surgery  Leave suitcase in the car. After surgery it may be brought to your room.  For patients admitted to the hospital, checkout time is 11:00 AM day of  discharge.                DO NOT WEAR JEWELRY, LOTIONS, POWDERS, OR PERFUMES.  WOMEN-- DO NOT SHAVE LEGS OR UNDERARMS FOR 48 HOURS BEFORE SHOWERS. MEN MAY SHAVE FACE.                                                                                                                                        [place] - Preparing for Surgery Before surgery, you can play an important role.  Because skin is not sterile, your skin needs to be as free of germs as possible.  You can reduce the number of germs on your skin by washing with CHG (chlorahexidine gluconate) soap before surgery.  CHG is an antiseptic cleaner which kills germs and bonds with the skin to continue killing germs even after washing. Please DO NOT use if you have an allergy to CHG or antibacterial soaps.  If your skin becomes reddened/irritated stop using the CHG and inform your nurse when you arrive at Short Stay. Do not shave (including legs and underarms) for at least 48 hours prior to the first CHG shower.  You may shave your face. Please follow these instructions carefully:  1.  Shower with CHG Soap the night before surgery and the  morning of Surgery.  2.  If you choose to wash your hair, wash your hair first as usual with your  normal  shampoo.  3.  After  you shampoo, rinse your hair and body thoroughly to remove the  shampoo.                           4.  Use CHG as you would any other liquid soap.  You can apply chg directly  to the skin and wash                       Gently with a scrungie or clean washcloth.  5.  Apply the CHG Soap to your body ONLY FROM THE NECK DOWN.  Do not use on open                           Wound or open sores. Avoid contact with eyes, ears mouth and genitals (private parts).                        Genitals (private parts) with your normal soap.             6.  Wash thoroughly, paying special attention to the area where your surgery  will be performed.  7.  Thoroughly rinse your body with warm water from the neck down.  8.  DO NOT shower/wash with your normal soap after using and rinsing off  the CHG Soap.                9.  Pat yourself dry with a clean towel.            10.  Wear clean pajamas.            11.  Place clean sheets on your bed the night of your first shower and do not  sleep with pets. Day of Surgery : Do not apply any lotions/deodorants the morning of surgery.  Please wear clean clothes to the hospital/surgery center.  FAILURE TO FOLLOW THESE INSTRUCTIONS MAY RESULT IN THE CANCELLATION OF YOUR SURGERY PATIENT SIGNATURE_________________________________  NURSE SIGNATURE__________________________________  ________________________________________________________________________    CLEAR LIQUID DIET   Foods Allowed                                                                     Foods Excluded  Coffee and tea, regular and decaf                             liquids that you cannot  Plain Jell-O in any flavor                                             see through such as: Fruit ices (not with fruit pulp)                                     milk, soups, orange juice  Iced Popsicles                                    All solid food Carbonated beverages, regular and diet                                     Cranberry, grape and apple juices Sports drinks like Gatorade Lightly seasoned clear broth or consume(fat free) Sugar, honey syrup  Sample Menu Breakfast  Lunch                                     Supper Cranberry juice                    Beef broth                            Chicken broth Jell-O                                     Grape juice                           Apple juice Coffee or tea                        Jell-O                                      Popsicle                                                Coffee or tea                        Coffee or tea  _____________________________________________________________________

## 2013-07-20 NOTE — Progress Notes (Signed)
cmet 07/11/13, ekg, chest 2/15 EPIC

## 2013-07-21 ENCOUNTER — Encounter (HOSPITAL_COMMUNITY): Payer: Self-pay | Admitting: Pharmacy Technician

## 2013-07-21 ENCOUNTER — Encounter (INDEPENDENT_AMBULATORY_CARE_PROVIDER_SITE_OTHER): Payer: Self-pay | Admitting: General Surgery

## 2013-07-21 ENCOUNTER — Ambulatory Visit (INDEPENDENT_AMBULATORY_CARE_PROVIDER_SITE_OTHER): Payer: BC Managed Care – PPO | Admitting: General Surgery

## 2013-07-21 VITALS — BP 130/80 | HR 98 | Temp 96.4°F | Resp 16 | Ht 63.5 in | Wt 263.8 lb

## 2013-07-21 MED ORDER — OXYCODONE HCL 5 MG/5ML PO SOLN: 5.0000 mg | ORAL | Status: DC | PRN

## 2013-07-21 NOTE — Patient Instructions (Signed)
Keep up with the preop diet  Congratulations on starting your journey to a healthier life! Over the next few weeks you will be undergoing tests (x-rays and labs) and seeing specialists to help evaluate you for weight loss surgery.  These tests and consultations with a psychologist and nutritionist are needed to prepare you for the lifestyle changes that lie ahead and are often required by insurance companies to approve you for surgery.   Pathway to Surgery:  Over the next few weeks -->Lab work -->Radiology tests   - Chest x-ray - make sure your lungs are normal before surgery  - Upper GI - you drink barium and pictures are taken as it travels down your  esophagus and into your stomach - looks for reflux and a hiatal hernia which may  need to repaired at the same time as your weight loss surgery  - Abdominal Ultrasound - looks at your gallbladder and liver  - Mammogram - up to date mammogram if you are a female -->EKG  -->Sleep study - if you are felt to be at high risk for obstructive sleep apnea -->H. Pylori breath test (BreathTek) - you surgeon may order this test to see if you have  a bacteria (H pylori) in your stomach which makes you at higher risk to develop a  ulcer or inflammation of your stomach -->Nutrition consultation -->Psychologist consultation -->Other specialist consults - your surgeon may determine that you need to see a  specialist like a cardiologist or pulmnologist depending on your health history -->Watch EMMI video about your planned weight loss surgery -->you can look at [email or web address] to learn more about weight loss surgery and  compare surgery outcomes -->you can look at our new website - [email or web address] - available mid-April 2015  Two weeks prior to surgery  Go on the extremely low carb liquid diet - this will decrease the size of your liver  which will make surgery safer - the nutritionist will go over this at a later date  Attend preoperative appointment  with your surgeon  Attend preoperative surgery class  One week prior to surgery  No aspirin products.  Tylenol is acceptable   24 hours prior to surgery  No alcoholic beverages  Report fever greater than 100.5 or excessive nasal drainage suggesting infection  Continue bariatric preop diet  Perform bowel prep if ordered  Do not eat or drink anything after midnight the night before surgery  Do not take any medications except those instructed by the anesthesiologist  Morning of surgery  Please arrive at the hospital at least 2 hours before your scheduled surgery time.  No makeup, fingernail polish or jewelry  Bring insurance cards with you  Bring your CPAP mask if you use this

## 2013-07-21 NOTE — Progress Notes (Signed)
Patient ID: Karen Mcintyre, female   DOB: 09/11/1990, 23 y.o.   MRN: [id number]  Chief Complaint  Patient presents with  . Bariatric Pre-op    HPI Karen Mcintyre is a 23 y.o. female.   HPI 23 year old morbidly obese Caucasian female comes in today for her preoperative appointment. She is currently scheduled to undergo laparoscopic Roux-en-Y gastric bypass on May 18. Her initial visit was on January 29 at which time her weight was 258 pounds.She denies any significant medical changes since she was initially seen. She has completed her preoperative workup and evaluation and has been approved for surgery Past Medical History  Diagnosis Date  . Obesity   . Obesity     History reviewed. No pertinent past surgical history.  Family History  Problem Relation Age of Onset  . Hyperlipidemia Mother   . Alcohol abuse Father   . Cancer Paternal Grandmother     lung cancer  . Obesity Other     Social History History  Substance Use Topics  . Smoking status: Never Smoker   . Smokeless tobacco: Never Used  . Alcohol Use: No    Allergies  Allergen Reactions  . Diphenhydramine Hcl     REACTION: urticaria (hives)  . Nitrofurantoin     REACTION: unspecified    Current Outpatient Prescriptions  Medication Sig Dispense Refill  . Multiple Vitamin (MULTIVITAMIN WITH MINERALS) TABS tablet Take 1 tablet by mouth daily.      . Multiple Vitamins-Minerals (HAIR/SKIN/NAILS PO) Take 1 tablet by mouth daily.       No current facility-administered medications for this visit.    Review of Systems Review of Systems  Constitutional: Negative for fever, activity change, appetite change and unexpected weight change.  HENT: Negative for nosebleeds and trouble swallowing.   Eyes: Negative for photophobia and visual disturbance.  Respiratory: Negative for chest tightness and shortness of breath.   Cardiovascular: Negative for chest pain and leg swelling.       Denies CP, SOB, orthopnea, PND, DOE   Gastrointestinal:       Denies gerd  Genitourinary: Negative for dysuria and difficulty urinating.  Musculoskeletal: Negative for arthralgias.  Skin: Negative for pallor and rash.  Neurological: Negative for dizziness, seizures, facial asymmetry and numbness.          Hematological: Negative for adenopathy. Does not bruise/bleed easily.  Psychiatric/Behavioral: Negative for behavioral problems and agitation.    There were no vitals taken for this visit.  Physical Exam Physical Exam  Vitals reviewed. Constitutional: She is oriented to person, place, and time. She appears well-developed and well-nourished. No distress.  Morbidly obese  HENT:  Head: Normocephalic and atraumatic.  Right Ear: External ear normal.  Left Ear: External ear normal.  Eyes: Conjunctivae are normal. No scleral icterus.  Neck: Normal range of motion. Neck supple. No tracheal deviation present. No thyromegaly present.  Cardiovascular: Normal rate and normal heart sounds.   Pulmonary/Chest: Effort normal and breath sounds normal. No stridor. No respiratory distress. She has no wheezes.  Abdominal: Soft. She exhibits no distension. There is no tenderness. There is no rebound and no guarding.  Musculoskeletal: She exhibits no edema and no tenderness.  Lymphadenopathy:    She has no cervical adenopathy.  Neurological: She is alert and oriented to person, place, and time. She exhibits normal muscle tone.  Skin: Skin is warm and dry. No rash noted. She is not diaphoretic. No erythema.  Psychiatric: She has a normal mood and affect. Her behavior  is normal. Judgment and thought content normal.    Data Reviewed My office note UGI - small hiatal hernia abd u/s - nml Screening labs - all wnl except LDL 101 was not fasting Nutrition note Psych note  Assessment    Morbid obesity BMI 46 Bilateral knee pain Low back pain     Plan    We reviewed her preoperative workup today. All of her questions were asked  and answered. We rediscussed the typical postoperative course and hospitalization. We discussed the importance of the preoperative diet. We discussed the upper GI finding of a small hiatal hernia. She has not had any GERD symptoms. I explained that we would test for hiatal hernia during surgery and possibly repair it if a clinically significant hernia was detected. We discussed potential hiatal hernia repair. She was given her prescription for her pain medication as well as her bowel prep instructions  Karen Mcintyre. Karen Mcintyre         Karen Mcintyre Karen Mcintyre 07/21/2013, 9:51 AM

## 2013-07-22 ENCOUNTER — Encounter (HOSPITAL_COMMUNITY)
Admission: RE | Admit: 2013-07-22 | Discharge: 2013-07-22 | Disposition: A | Discharge status: Home / Self Care | Payer: BC Managed Care – PPO | Source: Ambulatory Visit | Attending: General Surgery | Admitting: General Surgery

## 2013-07-22 ENCOUNTER — Encounter (HOSPITAL_COMMUNITY): Payer: Self-pay

## 2013-07-22 DIAGNOSIS — Z01812 Encounter for preprocedural laboratory examination: Secondary | ICD-10-CM | POA: Insufficient documentation

## 2013-07-22 LAB — CBC WITH DIFFERENTIAL/PLATELET
BASOS ABS: 0 10*3/uL (ref 0.0–0.1)
BASOS PCT: 0 % (ref 0–1)
EOS ABS: 0.1 10*3/uL (ref 0.0–0.7)
EOS PCT: 1 % (ref 0–5)
HEMATOCRIT: 38.1 % (ref 36.0–46.0)
Hemoglobin: 13.6 g/dL (ref 12.0–15.0)
Lymphocytes Relative: 24 % (ref 12–46)
Lymphs Abs: 2 10*3/uL (ref 0.7–4.0)
MCH: 29.9 pg (ref 26.0–34.0)
MCHC: 35.7 g/dL (ref 30.0–36.0)
MCV: 83.7 fL (ref 78.0–100.0)
MONO ABS: 0.6 10*3/uL (ref 0.1–1.0)
Monocytes Relative: 7 % (ref 3–12)
Neutro Abs: 5.7 10*3/uL (ref 1.7–7.7)
Neutrophils Relative %: 68 % (ref 43–77)
PLATELETS: 323 10*3/uL (ref 150–400)
RBC: 4.55 MIL/uL (ref 3.87–5.11)
RDW: 11.6 % (ref 11.5–15.5)
WBC: 8.4 10*3/uL (ref 4.0–10.5)

## 2013-08-01 ENCOUNTER — Inpatient Hospital Stay (HOSPITAL_COMMUNITY)
Admission: RE | Admit: 2013-08-01 | Discharge: 2013-08-03 | DRG: 621 | Disposition: A | Discharge status: Home / Self Care | Payer: BC Managed Care – PPO | Source: Ambulatory Visit | Attending: General Surgery | Admitting: General Surgery

## 2013-08-01 ENCOUNTER — Encounter (HOSPITAL_COMMUNITY): Payer: BC Managed Care – PPO | Admitting: Anesthesiology

## 2013-08-01 ENCOUNTER — Encounter (HOSPITAL_COMMUNITY): Payer: Self-pay | Admitting: *Deleted

## 2013-08-01 ENCOUNTER — Encounter (HOSPITAL_COMMUNITY)
Admission: RE | Disposition: A | Discharge status: Home / Self Care | Payer: Self-pay | Source: Ambulatory Visit | Attending: General Surgery

## 2013-08-01 ENCOUNTER — Inpatient Hospital Stay (HOSPITAL_COMMUNITY): Payer: BC Managed Care – PPO | Admitting: Anesthesiology

## 2013-08-01 DIAGNOSIS — M25569 Pain in unspecified knee: Secondary | ICD-10-CM

## 2013-08-01 DIAGNOSIS — Z6841 Body Mass Index (BMI) 40.0 and over, adult: Secondary | ICD-10-CM

## 2013-08-01 DIAGNOSIS — M545 Low back pain, unspecified: Secondary | ICD-10-CM

## 2013-08-01 DIAGNOSIS — R142 Eructation: Secondary | ICD-10-CM | POA: Diagnosis present

## 2013-08-01 DIAGNOSIS — K449 Diaphragmatic hernia without obstruction or gangrene: Secondary | ICD-10-CM | POA: Diagnosis present

## 2013-08-01 DIAGNOSIS — R141 Gas pain: Secondary | ICD-10-CM | POA: Diagnosis present

## 2013-08-01 DIAGNOSIS — R143 Flatulence: Secondary | ICD-10-CM

## 2013-08-01 DIAGNOSIS — Z01812 Encounter for preprocedural laboratory examination: Secondary | ICD-10-CM

## 2013-08-01 HISTORY — PX: GASTRIC ROUX-EN-Y: SHX5262

## 2013-08-01 LAB — HEMOGLOBIN AND HEMATOCRIT, BLOOD
HCT: 37.6 % (ref 36.0–46.0)
HEMOGLOBIN: 13.1 g/dL (ref 12.0–15.0)

## 2013-08-01 LAB — PREGNANCY, URINE: Preg Test, Ur: NEGATIVE

## 2013-08-01 SURGERY — LAPAROSCOPIC ROUX-EN-Y GASTRIC BYPASS WITH UPPER ENDOSCOPY
Anesthesia: General

## 2013-08-01 MED ORDER — CHLORHEXIDINE GLUCONATE CLOTH 2 % EX PADS: 6.0000 | MEDICATED_PAD | Freq: Once | CUTANEOUS | Status: DC

## 2013-08-01 MED ORDER — MORPHINE SULFATE 2 MG/ML IJ SOLN
2.0000 mg | INTRAMUSCULAR | Status: DC | PRN
  Administered 2013-08-01: 4 mg via INTRAVENOUS
  Administered 2013-08-01 – 2013-08-02 (×3): 2 mg via INTRAVENOUS
  Administered 2013-08-02 (×3): 4 mg via INTRAVENOUS
  Administered 2013-08-03: 2 mg via INTRAVENOUS
  Filled 2013-08-01 (×2): qty 2
  Filled 2013-08-01 (×3): qty 1
  Filled 2013-08-01: qty 2
  Filled 2013-08-01: qty 1
  Filled 2013-08-01: qty 2

## 2013-08-01 MED ORDER — ACETAMINOPHEN 10 MG/ML IV SOLN
1000.0000 mg | Freq: Once | INTRAVENOUS | Status: DC
Start: 2013-08-01 — End: 2013-08-01
  Filled 2013-08-01: qty 100

## 2013-08-01 MED ORDER — LACTATED RINGERS IV SOLN: INTRAVENOUS | Status: DC

## 2013-08-01 MED ORDER — OXYCODONE HCL 5 MG/5ML PO SOLN
5.0000 mg | ORAL | Status: DC | PRN
  Administered 2013-08-02: 5 mg via ORAL
  Filled 2013-08-01: qty 10

## 2013-08-01 MED ORDER — PROMETHAZINE HCL 25 MG/ML IJ SOLN
6.2500 mg | INTRAMUSCULAR | Status: DC | PRN
Start: 2013-08-01 — End: 2013-08-01
  Administered 2013-08-01: 6.25 mg via INTRAVENOUS

## 2013-08-01 MED ORDER — SODIUM CHLORIDE 0.9 % IJ SOLN
INTRAMUSCULAR | Status: AC
Start: 2013-08-01 — End: 2013-08-02
  Filled 2013-08-01: qty 10

## 2013-08-01 MED ORDER — KCL IN DEXTROSE-NACL 20-5-0.45 MEQ/L-%-% IV SOLN
INTRAVENOUS | Status: AC
  Filled 2013-08-01: qty 1000

## 2013-08-01 MED ORDER — SUCCINYLCHOLINE CHLORIDE 20 MG/ML IJ SOLN
INTRAMUSCULAR | Status: DC | PRN
  Administered 2013-08-01: 100 mg via INTRAVENOUS

## 2013-08-01 MED ORDER — EVICEL 5 ML EX KIT
PACK | CUTANEOUS | Status: DC | PRN
  Administered 2013-08-01: 2

## 2013-08-01 MED ORDER — BUPIVACAINE-EPINEPHRINE 0.25% -1:200000 IJ SOLN
INTRAMUSCULAR | Status: DC | PRN
  Administered 2013-08-01: 50 mL

## 2013-08-01 MED ORDER — SUFENTANIL CITRATE 50 MCG/ML IV SOLN
INTRAVENOUS | Status: DC | PRN
  Administered 2013-08-01: 10 ug via INTRAVENOUS
  Administered 2013-08-01 (×3): 5 ug via INTRAVENOUS
  Administered 2013-08-01 (×2): 10 ug via INTRAVENOUS
  Administered 2013-08-01: 5 ug via INTRAVENOUS

## 2013-08-01 MED ORDER — UNJURY VANILLA POWDER: 2.0000 [oz_av] | Freq: Four times a day (QID) | ORAL | Status: DC

## 2013-08-01 MED ORDER — CISATRACURIUM BESYLATE 20 MG/10ML IV SOLN
INTRAVENOUS | Status: AC
  Filled 2013-08-01: qty 10

## 2013-08-01 MED ORDER — ONDANSETRON HCL 4 MG/2ML IJ SOLN
INTRAMUSCULAR | Status: AC
  Filled 2013-08-01: qty 2

## 2013-08-01 MED ORDER — BUPIVACAINE-EPINEPHRINE 0.25% -1:200000 IJ SOLN
INTRAMUSCULAR | Status: AC
  Filled 2013-08-01: qty 1

## 2013-08-01 MED ORDER — MIDAZOLAM HCL 2 MG/2ML IJ SOLN
INTRAMUSCULAR | Status: AC
  Filled 2013-08-01: qty 2

## 2013-08-01 MED ORDER — FENTANYL CITRATE 0.05 MG/ML IJ SOLN
25.0000 ug | INTRAMUSCULAR | Status: DC | PRN
  Administered 2013-08-01: 50 ug via INTRAVENOUS
  Administered 2013-08-01: 25 ug via INTRAVENOUS

## 2013-08-01 MED ORDER — SODIUM CHLORIDE 0.9 % IJ SOLN
INTRAMUSCULAR | Status: AC
  Filled 2013-08-01: qty 10

## 2013-08-01 MED ORDER — CISATRACURIUM BESYLATE (PF) 10 MG/5ML IV SOLN
INTRAVENOUS | Status: DC | PRN
  Administered 2013-08-01: 16 mg via INTRAVENOUS
  Administered 2013-08-01 (×2): 4 mg via INTRAVENOUS

## 2013-08-01 MED ORDER — HEPARIN SODIUM (PORCINE) 5000 UNIT/ML IJ SOLN
5000.0000 [IU] | INTRAMUSCULAR | Status: AC
  Administered 2013-08-01: 5000 [IU] via SUBCUTANEOUS
  Filled 2013-08-01: qty 1

## 2013-08-01 MED ORDER — PROPOFOL 10 MG/ML IV BOLUS
INTRAVENOUS | Status: DC | PRN
  Administered 2013-08-01: 150 mg via INTRAVENOUS

## 2013-08-01 MED ORDER — MEPERIDINE HCL 50 MG/ML IJ SOLN: 6.2500 mg | INTRAMUSCULAR | Status: DC | PRN

## 2013-08-01 MED ORDER — TISSEEL VH 10 ML EX KIT
PACK | CUTANEOUS | Status: AC
  Filled 2013-08-01: qty 2

## 2013-08-01 MED ORDER — KCL IN DEXTROSE-NACL 20-5-0.45 MEQ/L-%-% IV SOLN
INTRAVENOUS | Status: DC
  Administered 2013-08-01: 1000 mL via INTRAVENOUS
  Administered 2013-08-02 – 2013-08-03 (×4): via INTRAVENOUS
  Filled 2013-08-01 (×9): qty 1000

## 2013-08-01 MED ORDER — CEFOXITIN SODIUM 2 G IV SOLR
2.0000 g | INTRAVENOUS | Status: AC
  Administered 2013-08-01: 2 g via INTRAVENOUS

## 2013-08-01 MED ORDER — PROMETHAZINE HCL 25 MG/ML IJ SOLN
INTRAMUSCULAR | Status: AC
Start: 2013-08-01 — End: 2013-08-02
  Filled 2013-08-01: qty 1

## 2013-08-01 MED ORDER — 0.9 % SODIUM CHLORIDE (POUR BTL) OPTIME
TOPICAL | Status: DC | PRN
  Administered 2013-08-01: 1000 mL

## 2013-08-01 MED ORDER — LIDOCAINE HCL (CARDIAC) 20 MG/ML IV SOLN
INTRAVENOUS | Status: DC | PRN
  Administered 2013-08-01: 100 mg via INTRAVENOUS

## 2013-08-01 MED ORDER — UNJURY CHICKEN SOUP POWDER: 2.0000 [oz_av] | Freq: Four times a day (QID) | ORAL | Status: DC

## 2013-08-01 MED ORDER — ACETAMINOPHEN 10 MG/ML IV SOLN
1000.0000 mg | Freq: Four times a day (QID) | INTRAVENOUS | Status: AC
  Administered 2013-08-01 – 2013-08-02 (×4): 1000 mg via INTRAVENOUS
  Filled 2013-08-01 (×4): qty 100

## 2013-08-01 MED ORDER — GLYCOPYRROLATE 0.2 MG/ML IJ SOLN
INTRAMUSCULAR | Status: DC | PRN
  Administered 2013-08-01: .6 mg via INTRAVENOUS

## 2013-08-01 MED ORDER — MIDAZOLAM HCL 5 MG/5ML IJ SOLN
INTRAMUSCULAR | Status: DC | PRN
  Administered 2013-08-01: 2 mg via INTRAVENOUS

## 2013-08-01 MED ORDER — ONDANSETRON HCL 4 MG/2ML IJ SOLN
INTRAMUSCULAR | Status: DC | PRN
  Administered 2013-08-01: 4 mg via INTRAVENOUS

## 2013-08-01 MED ORDER — FENTANYL CITRATE 0.05 MG/ML IJ SOLN
INTRAMUSCULAR | Status: AC
  Filled 2013-08-01: qty 2

## 2013-08-01 MED ORDER — NEOSTIGMINE METHYLSULFATE 10 MG/10ML IV SOLN
INTRAVENOUS | Status: DC | PRN
  Administered 2013-08-01: 5 mg via INTRAVENOUS

## 2013-08-01 MED ORDER — SUFENTANIL CITRATE 50 MCG/ML IV SOLN
INTRAVENOUS | Status: AC
  Filled 2013-08-01: qty 1

## 2013-08-01 MED ORDER — DEXTROSE 5 % IV SOLN
INTRAVENOUS | Status: AC
  Filled 2013-08-01: qty 2

## 2013-08-01 MED ORDER — ACETAMINOPHEN 10 MG/ML IV SOLN
INTRAVENOUS | Status: DC | PRN
  Administered 2013-08-01: 1000 mg via INTRAVENOUS

## 2013-08-01 MED ORDER — PROPOFOL 10 MG/ML IV BOLUS
INTRAVENOUS | Status: AC
  Filled 2013-08-01: qty 20

## 2013-08-01 MED ORDER — LACTATED RINGERS IR SOLN
Status: DC | PRN
  Administered 2013-08-01: 3000 mL

## 2013-08-01 MED ORDER — LACTATED RINGERS IV SOLN
INTRAVENOUS | Status: DC
  Administered 2013-08-01: 1000 mL via INTRAVENOUS
  Administered 2013-08-01: 12:00:00 via INTRAVENOUS

## 2013-08-01 MED ORDER — PANTOPRAZOLE SODIUM 40 MG IV SOLR
40.0000 mg | INTRAVENOUS | Status: DC
  Administered 2013-08-01 – 2013-08-02 (×2): 40 mg via INTRAVENOUS
  Filled 2013-08-01 (×3): qty 40

## 2013-08-01 MED ORDER — ENOXAPARIN SODIUM 40 MG/0.4ML ~~LOC~~ SOLN
40.0000 mg | Freq: Two times a day (BID) | SUBCUTANEOUS | Status: DC
  Administered 2013-08-02 – 2013-08-03 (×3): 40 mg via SUBCUTANEOUS
  Filled 2013-08-01 (×5): qty 0.4

## 2013-08-01 MED ORDER — ACETAMINOPHEN 160 MG/5ML PO SOLN: 325.0000 mg | ORAL | Status: DC | PRN

## 2013-08-01 MED ORDER — ACETAMINOPHEN 160 MG/5ML PO SOLN: 650.0000 mg | ORAL | Status: DC | PRN

## 2013-08-01 MED ORDER — UNJURY CHOCOLATE CLASSIC POWDER
2.0000 [oz_av] | Freq: Four times a day (QID) | ORAL | Status: DC
  Administered 2013-08-03: 2 [oz_av] via ORAL

## 2013-08-01 MED ORDER — ONDANSETRON HCL 4 MG/2ML IJ SOLN
4.0000 mg | INTRAMUSCULAR | Status: DC | PRN
  Administered 2013-08-01 – 2013-08-02 (×2): 4 mg via INTRAVENOUS
  Filled 2013-08-01 (×2): qty 2

## 2013-08-01 MED ORDER — LIDOCAINE HCL (CARDIAC) 20 MG/ML IV SOLN
INTRAVENOUS | Status: AC
  Filled 2013-08-01: qty 5

## 2013-08-01 SURGICAL SUPPLY — 61 items
ADH SKN CLS APL DERMABOND .7 (GAUZE/BANDAGES/DRESSINGS) ×2
APL SRG 32X5 SNPLK LF DISP (MISCELLANEOUS) ×2
APPLICATOR COTTON TIP 6IN STRL (MISCELLANEOUS) ×2 IMPLANT
BLADE SURG SZ11 CARB STEEL (BLADE) ×3 IMPLANT
CABLE HIGH FREQUENCY MONO STRZ (ELECTRODE) IMPLANT
CHLORAPREP W/TINT 26ML (MISCELLANEOUS) ×6 IMPLANT
CLIP SUT LAPRA TY ABSORB (SUTURE) ×6 IMPLANT
CUTTER LINEAR ENDO ART 45 ETS (STAPLE) ×3 IMPLANT
DERMABOND ADVANCED (GAUZE/BANDAGES/DRESSINGS) ×4
DERMABOND ADVANCED .7 DNX12 (GAUZE/BANDAGES/DRESSINGS) IMPLANT
DEVICE SUTURE ENDOST 10MM (ENDOMECHANICALS) ×3 IMPLANT
DRAIN PENROSE 18X1/4 LTX STRL (WOUND CARE) ×3 IMPLANT
DRAPE CAMERA CLOSED 9X96 (DRAPES) ×3 IMPLANT
ELECT REM PT RETURN 9FT ADLT (ELECTROSURGICAL) ×3
ELECTRODE REM PT RTRN 9FT ADLT (ELECTROSURGICAL) ×1 IMPLANT
GAUZE SPONGE 4X4 16PLY XRAY LF (GAUZE/BANDAGES/DRESSINGS) ×3 IMPLANT
GLOVE BIOGEL M STRL SZ7.5 (GLOVE) ×2 IMPLANT
GOWN STRL REUS W/TWL XL LVL3 (GOWN DISPOSABLE) ×14 IMPLANT
HOVERMATT SINGLE USE (MISCELLANEOUS) ×3 IMPLANT
KIT BASIN OR (CUSTOM PROCEDURE TRAY) ×3 IMPLANT
KIT GASTRIC LAVAGE 34FR ADT (SET/KITS/TRAYS/PACK) ×3 IMPLANT
MARKER SKIN DUAL TIP RULER LAB (MISCELLANEOUS) ×3 IMPLANT
NDL SPNL 22GX3.5 QUINCKE BK (NEEDLE) ×1 IMPLANT
NEEDLE SPNL 22GX3.5 QUINCKE BK (NEEDLE) ×3 IMPLANT
PACK CARDIOVASCULAR III (CUSTOM PROCEDURE TRAY) ×3 IMPLANT
RELOAD 45 VASCULAR/THIN (ENDOMECHANICALS) ×3 IMPLANT
RELOAD BLUE (STAPLE) ×9 IMPLANT
RELOAD ENDO STITCH 2.0 (ENDOMECHANICALS) ×30
RELOAD GOLD (STAPLE) ×3 IMPLANT
RELOAD STAPLE 45 2.5 WHT GRN (ENDOMECHANICALS) ×1 IMPLANT
RELOAD STAPLE 45 3.5 BLU ETS (ENDOMECHANICALS) ×1 IMPLANT
RELOAD STAPLE TA45 3.5 REG BLU (ENDOMECHANICALS) ×6 IMPLANT
RELOAD SUT SNGL STCH ABSRB 2-0 (ENDOMECHANICALS) ×4 IMPLANT
RELOAD WHITE ECR60W (STAPLE) ×3 IMPLANT
SCISSORS LAP 5X35 DISP (ENDOMECHANICALS) ×3 IMPLANT
SEALANT SURGICAL APPL DUAL CAN (MISCELLANEOUS) ×5 IMPLANT
SET IRRIG TUBING LAPAROSCOPIC (IRRIGATION / IRRIGATOR) ×3 IMPLANT
SHEARS HARMONIC ACE PLUS 45CM (MISCELLANEOUS) ×3 IMPLANT
SLEEVE ADV FIXATION 12X100MM (TROCAR) ×6 IMPLANT
SLEEVE XCEL OPT CAN 5 100 (ENDOMECHANICALS) ×3 IMPLANT
SOLUTION ANTI FOG 6CC (MISCELLANEOUS) ×3 IMPLANT
SPONGE GAUZE 4X4 12PLY (GAUZE/BANDAGES/DRESSINGS) IMPLANT
STAPLE ECHEON FLEX 60 POW ENDO (STAPLE) ×3 IMPLANT
STAPLER VISISTAT 35W (STAPLE) IMPLANT
SUT MNCRL AB 4-0 PS2 18 (SUTURE) ×5 IMPLANT
SUT RELOAD ENDO STITCH 2 48X1 (ENDOMECHANICALS) ×6
SUT RELOAD ENDO STITCH 2.0 (ENDOMECHANICALS) ×4
SUT VIC AB 2-0 SH 27 (SUTURE) ×3
SUT VIC AB 2-0 SH 27X BRD (SUTURE) ×1 IMPLANT
SUTURE RELOAD END STTCH 2 48X1 (ENDOMECHANICALS) ×6 IMPLANT
SUTURE RELOAD ENDO STITCH 2.0 (ENDOMECHANICALS) ×4 IMPLANT
SYR 20CC LL (SYRINGE) ×6 IMPLANT
TOWEL OR 17X26 10 PK STRL BLUE (TOWEL DISPOSABLE) ×3 IMPLANT
TOWEL OR NON WOVEN STRL DISP B (DISPOSABLE) ×3 IMPLANT
TRAY FOLEY CATH 14FRSI W/METER (CATHETERS) ×3 IMPLANT
TROCAR BLADELESS OPT 5 100 (ENDOMECHANICALS) ×3 IMPLANT
TROCAR UNIVERSAL OPT 12M 100M (ENDOMECHANICALS) ×9 IMPLANT
TROCAR XCEL 12X100 BLDLESS (ENDOMECHANICALS) ×3 IMPLANT
TUBING ENDO SMARTCAP (MISCELLANEOUS) ×2 IMPLANT
TUBING ENDO SMARTCAP PENTAX (MISCELLANEOUS) ×1 IMPLANT
TUBING FILTER THERMOFLATOR (ELECTROSURGICAL) ×3 IMPLANT

## 2013-08-01 NOTE — Anesthesia Postprocedure Evaluation (Signed)
  Anesthesia Post-op Note  Patient: [name]  Procedure(s) Performed: Procedure(s) (LRB): LAPAROSCOPIC ROUX-EN-Y GASTRIC BYPASS WITH UPPER ENDOSCOPY WITH LAPAROSCOPIC REPAIR OF HIATAL HERNIA (N/A)  Patient Location: PACU  Anesthesia Type: General  Level of Consciousness: awake and alert   Airway and Oxygen Therapy: Patient Spontanous Breathing  Post-op Pain: mild  Post-op Assessment: Post-op Vital signs reviewed, Patient's Cardiovascular Status Stable, Respiratory Function Stable, Patent Airway and No signs of Nausea or vomiting  Last Vitals:  Filed Vitals:   08/01/13 1515  BP: 120/81  Pulse: 74  Temp: 36.8 C  Resp: 16    Post-op Vital Signs: stable   Complications: No apparent anesthesia complications

## 2013-08-01 NOTE — Op Note (Signed)
Karen Mcintyre [id number] 02/19/1991. 08/01/2013  Preoperative diagnosis:  1. Morbid obesity (BMI 45)  2. Low back pain 3. Bilateral knee pain  Postoperative  diagnosis:  1. same  Surgical procedure: Laparoscopic Roux-en-Y gastric bypass (ante-colic, ante-gastric); upper endoscopy  Surgeon: Atilano InaEric M Michelle Wnek, M.D. FACS  Asst.: Luretha MurphyMatthew Martin, MD FACS  Anesthesia: General plus 0.25% marcaine with epi  Complications: None   EBL: Minimal   Drains: None   Disposition: PACU in good condition   Indications for procedure: 22yo WF with morbid obesity who has been unsuccessful at sustained weight loss. The patient's comorbidities are listed above. We discussed the risk and benefits of surgery including but not limited to anesthesia risk, bleeding, infection, blood clot formation, anastomotic leak, anastomotic stricture, ulcer formation, death, respiratory complications, intestinal blockage, internal hernia, gallstone formation, vitamin and nutritional deficiencies, injury to surrounding structures, failure to lose weight and mood changes.   Description of procedure: Patient is brought to the operating room and general anesthesia induced. The patient had received preoperative broad-spectrum IV antibiotics and subcutaneous heparin. The abdomen was widely sterilely prepped with Chloraprep and draped. Patient timeout was performed and correct patient and procedure confirmed. Access was obtained with a 12 mm Optiview trocar in the left upper quadrant and pneumoperitoneum established without difficulty. Under direct vision 12 mm trocars were placed laterally in the right upper quadrant, right upper quadrant midclavicular line, and to the left and above the umbilicus for the camera port. A 5 mm trocar was placed laterally in the left upper quadrant.  The omentum was brought into the upper abdomen and the transverse mesocolon elevated and the ligament of Treitz clearly identified. A 40 cm biliopancreatic limb  was then carefully measured from the ligament of Treitz. The small intestine was divided at this point with a single firing of the white load linear stapler. A Penrose drain was sutured to the end of the Roux-en-Y limb for later identification. A 100 cm Roux-en-Y limb was then carefully measured. At this point a side-to-side anastomosis was created between the Roux limb and the end of the biliopancreatic limb. This was accomplished with a single firing of the 45 mm white load linear stapler. The common enterotomy was closed with a running 2-0 Vicryl begun at either end of the enterotomy and tied centrally. Tisseel tissue sealant was placed over the anastomosis. The mesenteric defect was then closed with running 2-0 silk. The omentum was then divided with the harmonic scalpel up towards the transverse colon to allow mobility of the Roux limb toward the gastric pouch. The patient was then placed in steep reversed Trendelenburg. Through a 5 mm subxiphoid site the [facility] retractor was placed and the left lobe of the liver elevated with excellent exposure of the upper stomach and hiatus. The angle of Hiss was then mobilized with the harmonic scalpel. A 4 cm gastric pouch was then carefully measured along the lesser curve of the stomach. Dissection was carried along the lesser curve at this point with the Harmonic scalpel working carefully back toward the lesser sac at right angles to the lesser curve. The free lesser sac was then entered. After being sure all tubes were removed from the stomach an initial firing of the gold load 60 mm linear stapler was fired at right angles across the lesser curve for about 4 cm. The gastric pouch was further mobilized posteriorly and then the pouch was completed with 4 further firings of the 60 mm blue load linear stapler and 1 final  firing of a blue load 45mm linear stapler up through the previously dissected angle of His. It was ensured that the pouch was completely mobilized away  from the gastric remnant. This created a nice tubular 4-5 cm gastric pouch.  The Roux limb was then brought up in an antecolic fashion with the candycane facing to the patient's left without undue tension. The gastrojejunostomy was created with an initial posterior row of 2-0 Vicryl between the Roux limb and the staple line of the gastric pouch. Enterotomies were then made in the gastric pouch and the Roux limb with the harmonic scalpel and at approximately 2-2-1/2 cm anastomosis was created with a single firing of the 45mm blue load linear stapler. The staple line was inspected and was intact without bleeding. The common enterotomy was then closed with running 2-0 Vicryl begun at either end and tied centrally. The Ewall tube was then easily passed through the anastomosis and an outer anterior layer of running 2-0 Vicryl was placed. The Ewald tube was removed. With the outlet of the gastrojejunostomy clamped and under saline irrigation the assistant performed upper endoscopy and with the gastric pouch tensely distended with air-there was no evidence of leak on this test. The pouch was desufflated. The Vonita Mosseterson defect was closed with running 2-0 silk. The abdomen was inspected for any evidence of bleeding or bowel injury and everything looked fine. The Nathanson retractor was removed under direct vision after coating the anastomosis with Tisseel tissue sealant. All CO2 was evacuated and trochars removed. Skin incisions were closed with 4-0 monocryl in a subcuticular fashion followed by Dermabond. Sponge needle and instrument counts were correct. The patient was taken to the PACU in good condition.    Karen SellaEric M. [name], MD, FACS General, Bariatric, & Minimally Invasive Surgery [facility] [name]

## 2013-08-01 NOTE — Interval H&P Note (Signed)
History and Physical Interval Note:  08/01/2013 9:37 AM  Karen Mcintyre  has presented today for surgery, with the diagnosis of morbid obesity   The various methods of treatment have been discussed with the patient and family. After consideration of risks, benefits and other options for treatment, the patient has consented to  Procedure(s): LAPAROSCOPIC ROUX-EN-Y GASTRIC BYPASS WITH UPPER ENDOSCOPY WITH LAPAROSCOPIC REPAIR OF HIATAL HERNIA (N/A) as a surgical intervention .  The patient's history has been reviewed, patient examined, no change in status, stable for surgery.  I have reviewed the patient's chart and labs.  Questions were answered to the patient's satisfaction.    Karen SellaEric M. [name], MD, FACS General, Bariatric, & Minimally Invasive Surgery [facility] [name]   Karen Mcintyre

## 2013-08-01 NOTE — Anesthesia Preprocedure Evaluation (Addendum)
Anesthesia Evaluation  Patient identified by MRN, date of birth, ID band Patient awake    Reviewed: Allergy & Precautions, H&P , NPO status , Patient's Chart, lab work & pertinent test results  Airway Mallampati: II  TM Distance: >3 FB Neck ROM: Full    Dental no notable dental hx.    Pulmonary neg pulmonary ROS,  breath sounds clear to auscultation  Pulmonary exam normal       Cardiovascular negative cardio ROS  Rhythm:Regular Rate:Normal     Neuro/Psych negative neurological ROS  negative psych ROS   GI/Hepatic negative GI ROS, Neg liver ROS,   Endo/Other  Morbid obesity  Renal/GU negative Renal ROS  negative genitourinary   Musculoskeletal negative musculoskeletal ROS (+)   Abdominal   Peds negative pediatric ROS (+)  Hematology negative hematology ROS (+)   Anesthesia Other Findings   Reproductive/Obstetrics negative OB ROS                             Anesthesia Physical Anesthesia Plan  ASA: III  Anesthesia Plan: General   Post-op Pain Management:    Induction: Intravenous  Airway Management Planned: Oral ETT  Additional Equipment:   Intra-op Plan:   Post-operative Plan: Extubation in OR  Informed Consent: I have reviewed the patients History and Physical, chart, labs and discussed the procedure including the risks, benefits and alternatives for the proposed anesthesia with the patient or authorized representative who has indicated his/her understanding and acceptance.   Dental advisory given  Plan Discussed with: CRNA  Anesthesia Plan Comments:         Anesthesia Quick Evaluation  

## 2013-08-01 NOTE — Transfer of Care (Signed)
Immediate Anesthesia Transfer of Care Note  Patient: Karen Mcintyre  Procedure(s) Performed: Procedure(s): LAPAROSCOPIC ROUX-EN-Y GASTRIC BYPASS WITH UPPER ENDOSCOPY WITH LAPAROSCOPIC REPAIR OF HIATAL HERNIA (N/A)  Patient Location: PACU  Anesthesia Type:General  Level of Consciousness: awake, alert  and sedated  Airway & Oxygen Therapy: Patient Spontanous Breathing and Patient connected to face mask oxygen  Post-op Assessment: Report given to PACU RN and Post -op Vital signs reviewed and stable  Post vital signs: Reviewed and stable  Complications: No apparent anesthesia complications

## 2013-08-01 NOTE — H&P (View-Only) (Signed)
Patient ID: Karen Mcintyre, female   DOB: 06/23/1990, 22 y.o.   MRN: [id number]  Chief Complaint  Patient presents with  . Bariatric Pre-op    HPI Karen Mcintyre is a 22 y.o. female.   HPI 22-year-old morbidly obese Caucasian female comes in today for her preoperative appointment. She is currently scheduled to undergo laparoscopic Roux-en-Y gastric bypass on May 18. Her initial visit was on January 29 at which time her weight was 258 pounds.She denies any significant medical changes since she was initially seen. She has completed her preoperative workup and evaluation and has been approved for surgery Past Medical History  Diagnosis Date  . Obesity   . Obesity     History reviewed. No pertinent past surgical history.  Family History  Problem Relation Age of Onset  . Hyperlipidemia Mother   . Alcohol abuse Father   . Cancer Paternal Grandmother     lung cancer  . Obesity Other     Social History History  Substance Use Topics  . Smoking status: Never Smoker   . Smokeless tobacco: Never Used  . Alcohol Use: No    Allergies  Allergen Reactions  . Diphenhydramine Hcl     REACTION: urticaria (hives)  . Nitrofurantoin     REACTION: unspecified    Current Outpatient Prescriptions  Medication Sig Dispense Refill  . Multiple Vitamin (MULTIVITAMIN WITH MINERALS) TABS tablet Take 1 tablet by mouth daily.      . Multiple Vitamins-Minerals (HAIR/SKIN/NAILS PO) Take 1 tablet by mouth daily.       No current facility-administered medications for this visit.    Review of Systems Review of Systems  Constitutional: Negative for fever, activity change, appetite change and unexpected weight change.  HENT: Negative for nosebleeds and trouble swallowing.   Eyes: Negative for photophobia and visual disturbance.  Respiratory: Negative for chest tightness and shortness of breath.   Cardiovascular: Negative for chest pain and leg swelling.       Denies CP, SOB, orthopnea, PND, DOE   Gastrointestinal:       Denies gerd  Genitourinary: Negative for dysuria and difficulty urinating.  Musculoskeletal: Negative for arthralgias.  Skin: Negative for pallor and rash.  Neurological: Negative for dizziness, seizures, facial asymmetry and numbness.          Hematological: Negative for adenopathy. Does not bruise/bleed easily.  Psychiatric/Behavioral: Negative for behavioral problems and agitation.    There were no vitals taken for this visit.  Physical Exam Physical Exam  Vitals reviewed. Constitutional: She is oriented to person, place, and time. She appears well-developed and well-nourished. No distress.  Morbidly obese  HENT:  Head: Normocephalic and atraumatic.  Right Ear: External ear normal.  Left Ear: External ear normal.  Eyes: Conjunctivae are normal. No scleral icterus.  Neck: Normal range of motion. Neck supple. No tracheal deviation present. No thyromegaly present.  Cardiovascular: Normal rate and normal heart sounds.   Pulmonary/Chest: Effort normal and breath sounds normal. No stridor. No respiratory distress. She has no wheezes.  Abdominal: Soft. She exhibits no distension. There is no tenderness. There is no rebound and no guarding.  Musculoskeletal: She exhibits no edema and no tenderness.  Lymphadenopathy:    She has no cervical adenopathy.  Neurological: She is alert and oriented to person, place, and time. She exhibits normal muscle tone.  Skin: Skin is warm and dry. No rash noted. She is not diaphoretic. No erythema.  Psychiatric: She has a normal mood and affect. Her behavior   is normal. Judgment and thought content normal.    Data Reviewed My office note UGI - small hiatal hernia abd u/s - nml Screening labs - all wnl except LDL 101 was not fasting Nutrition note Psych note  Assessment    Morbid obesity BMI 46 Bilateral knee pain Low back pain     Plan    We reviewed her preoperative workup today. All of her questions were asked  and answered. We rediscussed the typical postoperative course and hospitalization. We discussed the importance of the preoperative diet. We discussed the upper GI finding of a small hiatal hernia. She has not had any GERD symptoms. I explained that we would test for hiatal hernia during surgery and possibly repair it if a clinically significant hernia was detected. We discussed potential hiatal hernia repair. She was given her prescription for her pain medication as well as her bowel prep instructions  Mary Sellaric Karen. [name], MD, FACS General, Bariatric, & Minimally Invasive Surgery [facility]         Karen Mcintyre 07/21/2013, 9:51 AM

## 2013-08-02 ENCOUNTER — Inpatient Hospital Stay (HOSPITAL_COMMUNITY): Payer: BC Managed Care – PPO

## 2013-08-02 ENCOUNTER — Encounter (HOSPITAL_COMMUNITY): Payer: Self-pay | Admitting: General Surgery

## 2013-08-02 LAB — COMPREHENSIVE METABOLIC PANEL
ALBUMIN: 3.3 g/dL — AB (ref 3.5–5.2)
ALK PHOS: 55 U/L (ref 39–117)
ALT: 33 U/L (ref 0–35)
AST: 32 U/L (ref 0–37)
BILIRUBIN TOTAL: 0.3 mg/dL (ref 0.3–1.2)
BUN: 5 mg/dL — ABNORMAL LOW (ref 6–23)
CHLORIDE: 102 meq/L (ref 96–112)
CO2: 25 mEq/L (ref 19–32)
Calcium: 8.5 mg/dL (ref 8.4–10.5)
Creatinine, Ser: 0.68 mg/dL (ref 0.50–1.10)
GFR calc Af Amer: >90 mL/min (ref >90)
GFR calc non Af Amer: >90 mL/min (ref >90)
Glucose, Bld: 116 mg/dL — ABNORMAL HIGH (ref 70–99)
POTASSIUM: 3.6 meq/L — AB (ref 3.7–5.3)
SODIUM: 139 meq/L (ref 137–147)
TOTAL PROTEIN: 6.4 g/dL (ref 6.0–8.3)

## 2013-08-02 LAB — CBC WITH DIFFERENTIAL/PLATELET
BASOS ABS: 0 10*3/uL (ref 0.0–0.1)
BASOS PCT: 0 % (ref 0–1)
EOS ABS: 0 10*3/uL (ref 0.0–0.7)
Eosinophils Relative: 0 % (ref 0–5)
HCT: 34.9 % — ABNORMAL LOW (ref 36.0–46.0)
Hemoglobin: 12.2 g/dL (ref 12.0–15.0)
Lymphocytes Relative: 20 % (ref 12–46)
Lymphs Abs: 2 10*3/uL (ref 0.7–4.0)
MCH: 28.8 pg (ref 26.0–34.0)
MCHC: 35 g/dL (ref 30.0–36.0)
MCV: 82.5 fL (ref 78.0–100.0)
Monocytes Absolute: 0.8 10*3/uL (ref 0.1–1.0)
Monocytes Relative: 8 % (ref 3–12)
NEUTROS ABS: 7.5 10*3/uL (ref 1.7–7.7)
NEUTROS PCT: 72 % (ref 43–77)
PLATELETS: 321 10*3/uL (ref 150–400)
RBC: 4.23 MIL/uL (ref 3.87–5.11)
RDW: 11.5 % (ref 11.5–15.5)
WBC: 10.3 10*3/uL (ref 4.0–10.5)

## 2013-08-02 LAB — HEMOGLOBIN AND HEMATOCRIT, BLOOD
HCT: 34.5 % — ABNORMAL LOW (ref 36.0–46.0)
Hemoglobin: 12.1 g/dL (ref 12.0–15.0)

## 2013-08-02 MED ORDER — SIMETHICONE 40 MG/0.6ML PO SUSP
40.0000 mg | Freq: Four times a day (QID) | ORAL | Status: DC | PRN
  Administered 2013-08-02: 40 mg via ORAL
  Filled 2013-08-02: qty 0.6

## 2013-08-02 MED ORDER — IOHEXOL 300 MG/ML  SOLN
50.0000 mL | Freq: Once | INTRAMUSCULAR | Status: AC | PRN
  Administered 2013-08-02: 50 mL via ORAL

## 2013-08-02 NOTE — Progress Notes (Signed)
1 Day Post-Op  Subjective: Had epigastric gas pain last night. Walked multiple times  Objective: Vital signs in last 24 hours: Temp:  [98 F (36.7 C)-98.9 F (37.2 C)] 98.2 F (36.8 C) (05/19 16100608) Pulse Rate:  [59-88] 62 (05/19 0608) Resp:  [16-25] 20 (05/19 0608) BP: (106-131)/(68-85) 106/71 mmHg (05/19 0608) SpO2:  [94 %-100 %] 95 % (05/19 0608) Weight:  [256 lb 6.4 oz (116.302 kg)] 256 lb 6.4 oz (116.302 kg) (05/18 1515) Last BM Date: 08/01/13  Intake/Output from previous day: 05/18 0701 - 05/19 0700 In: 4639.6 [I.V.:4339.6; IV Piggyback:300] Out: 1400 [Urine:1400] Intake/Output this shift:    Alert, nad cta b/l Reg Soft, obese, incisions c/d/i. Expected mild TTP +SCD, no edema  Lab Results:   Recent Labs  08/01/13 1428 08/02/13 0350  WBC  --  10.3  HGB 13.1 12.2  HCT 37.6 34.9*  PLT  --  321   BMET  Recent Labs  08/02/13 0350  NA 139  K 3.6*  CL 102  CO2 25  GLUCOSE 116*  BUN 5*  CREATININE 0.68  CALCIUM 8.5   PT/INR No results found for this basename: LABPROT, INR,  in the last 72 hours ABG No results found for this basename: PHART, PCO2, PO2, HCO3,  in the last 72 hours  Studies/Results: No results found.  Anti-infectives: Anti-infectives   Start     Dose/Rate Route Frequency Ordered Stop   08/01/13 0838  cefOXitin (MEFOXIN) 2 g in dextrose 5 % 50 mL IVPB     2 g 100 mL/hr over 30 Minutes Intravenous On call to O.R. 08/01/13 [id number] 08/01/13 1030      Assessment/Plan: s/p Procedure(s): LAPAROSCOPIC ROUX-EN-Y GASTRIC BYPASS WITH UPPER ENDOSCOPY WITH   No fever, tachycardia For UGI this am - if ok will start POD 1 diet Ambulate, IS Cont lovenox Cont IVF Simethicone for gas pain once no longer NPO  Mary SellaEric M. [name], MD, FACS General, Bariatric, & Minimally Invasive Surgery [facility] [name]    LOS: 1 day    Karen Mcintyre 08/02/2013

## 2013-08-02 NOTE — Progress Notes (Signed)
Patient alert and oriented, pain is controlled. Ambulating ok. Reinforced incentive spirometry. Plan to advance to post-op day 1 diet, water if upper GI swallow ok. Will continue to monitor.  

## 2013-08-02 NOTE — Progress Notes (Signed)
Patient ID: Karen Mcintyre, female   DOB: 05/23/1990, 23 y.o.   MRN: [id number] [facility] Progress Note:   1 Day Post-Op  Subjective: Mental status is clear.  Resting and without complaints. Objective: Vital signs in last 24 hours: Temp:  [98.2 F (36.8 C)-98.9 F (37.2 C)] 98.2 F (36.8 C) (05/19 0608) Pulse Rate:  [59-87] 62 (05/19 0608) Resp:  [16-25] 20 (05/19 0608) BP: (106-125)/(68-85) 106/71 mmHg (05/19 0608) SpO2:  [94 %-100 %] 95 % (05/19 0608) Weight:  [256 lb 6.4 oz (116.302 kg)] 256 lb 6.4 oz (116.302 kg) (05/18 1515)  Intake/Output from previous day: 05/18 0701 - 05/19 0700 In: 4639.6 [I.V.:4339.6; IV Piggyback:300] Out: 1400 [Urine:1400] Intake/Output this shift: Total I/O In: 545.8 [I.V.:545.8] Out: 750 [Urine:750]  Physical Exam: Work of breathing is normal.  Abdominal pain is as anticipated  Lab Results:  Results for orders placed during the hospital encounter of 08/01/13 (from the past 48 hour(s))  PREGNANCY, URINE     Status: None   Collection Time    08/01/13  8:39 AM      Result Value Ref Range   Preg Test, Ur NEGATIVE  NEGATIVE   Comment:            THE SENSITIVITY OF THIS     METHODOLOGY IS >20 mIU/mL.  HEMOGLOBIN AND HEMATOCRIT, BLOOD     Status: None   Collection Time    08/01/13  2:28 PM      Result Value Ref Range   Hemoglobin 13.1  12.0 - 15.0 g/dL   HCT 37.6  36.0 - 46.0 %  CBC WITH DIFFERENTIAL     Status: Abnormal   Collection Time    08/02/13  3:50 AM      Result Value Ref Range   WBC 10.3  4.0 - 10.5 K/uL   RBC 4.23  3.87 - 5.11 MIL/uL   Hemoglobin 12.2  12.0 - 15.0 g/dL   HCT 34.9 (*) 36.0 - 46.0 %   MCV 82.5  78.0 - 100.0 fL   MCH 28.8  26.0 - 34.0 pg   MCHC 35.0  30.0 - 36.0 g/dL   RDW 11.5  11.5 - 15.5 %   Platelets 321  150 - 400 K/uL   Neutrophils Relative % 72  43 - 77 %   Neutro Abs 7.5  1.7 - 7.7 K/uL   Lymphocytes Relative 20  12 - 46 %   Lymphs Abs 2.0  0.7 - 4.0 K/uL   Monocytes Relative 8  3 - 12 %    Monocytes Absolute 0.8  0.1 - 1.0 K/uL   Eosinophils Relative 0  0 - 5 %   Eosinophils Absolute 0.0  0.0 - 0.7 K/uL   Basophils Relative 0  0 - 1 %   Basophils Absolute 0.0  0.0 - 0.1 K/uL  COMPREHENSIVE METABOLIC PANEL     Status: Abnormal   Collection Time    08/02/13  3:50 AM      Result Value Ref Range   Sodium 139  137 - 147 mEq/L   Potassium 3.6 (*) 3.7 - 5.3 mEq/L   Chloride 102  96 - 112 mEq/L   CO2 25  19 - 32 mEq/L   Glucose, Bld 116 (*) 70 - 99 mg/dL   BUN 5 (*) 6 - 23 mg/dL   Creatinine, Ser 0.68  0.50 - 1.10 mg/dL   Calcium 8.5  8.4 - 10.5 mg/dL   Total Protein 6.4    6.0 - 8.3 g/dL   Albumin 3.3 (*) 3.5 - 5.2 g/dL   AST 32  0 - 37 U/L   ALT 33  0 - 35 U/L   Alkaline Phosphatase 55  39 - 117 U/L   Total Bilirubin 0.3  0.3 - 1.2 mg/dL   GFR calc non Af Amer >90  >90 mL/min   GFR calc Af Amer >90  >90 mL/min   Comment: (NOTE)     The eGFR has been calculated using the CKD EPI equation.     This calculation has not been validated in all clinical situations.     eGFR's persistently <90 mL/min signify possible Chronic Kidney     Disease.    Radiology/Results: Dg Ugi W/water Sol Cm  08/02/2013   CLINICAL DATA:  Status post Roux-en-Y gastric bypass.  EXAM: WATER SOLUBLE UPPER GI SERIES  TECHNIQUE: Single-column upper GI series was performed using water soluble contrast.  CONTRAST:  50mL OMNIPAQUE IOHEXOL 300 MG/ML  SOLN  COMPARISON:  05/06/2013 pre procedure fluoroscopy.  FLUOROSCOPY TIME:  1 min and 24 seconds  FINDINGS: Preprocedure scout film is unremarkable.  Focused, small volume contrast study. Expected appearance of the gastric remnant. No evidence of contrast extravasation to suggest perforation. Normal postoperative appearance of the Roux loop. The jejunal-jejunal anastomosis is not well visualized.  IMPRESSION: Expected appearance after Roux-en-Y gastric bypass.   Electronically Signed   By: Kyle  Talbot M.D.   On: 08/02/2013 10:31     Anti-infectives: Anti-infectives   Start     Dose/Rate Route Frequency Ordered Stop   08/01/13 0838  cefOXitin (MEFOXIN) 2 g in dextrose 5 % 50 mL IVPB     2 g 100 mL/hr over 30 Minutes Intravenous On call to O.R. 08/01/13 0838 08/01/13 1030      Assessment/Plan: Problem List: Patient Active Problem List   Diagnosis Date Noted  . Morbid obesity 08/01/2013  . ASCUS (atypical squamous cells of undetermined significance) on Pap smear 02/23/2012  . Contraception management 02/11/2012  . Obesity, Class III, BMI 40-49.9 (morbid obesity) 08/26/2011    UGI reviewed and looks OK.  Will begin PD 1 diet.   1 Day Post-Op    LOS: 1 day   Matt B. Martin, MD, FACS  [facility] [phone] beeper [phone]  08/02/2013 12:07 PM  

## 2013-08-03 ENCOUNTER — Telehealth (INDEPENDENT_AMBULATORY_CARE_PROVIDER_SITE_OTHER): Payer: Self-pay

## 2013-08-03 LAB — CBC WITH DIFFERENTIAL/PLATELET
Basophils Absolute: 0 10*3/uL (ref 0.0–0.1)
Basophils Relative: 0 % (ref 0–1)
EOS PCT: 2 % (ref 0–5)
Eosinophils Absolute: 0.2 10*3/uL (ref 0.0–0.7)
HCT: 34.7 % — ABNORMAL LOW (ref 36.0–46.0)
Hemoglobin: 12 g/dL (ref 12.0–15.0)
LYMPHS ABS: 1.8 10*3/uL (ref 0.7–4.0)
LYMPHS PCT: 23 % (ref 12–46)
MCH: 29.3 pg (ref 26.0–34.0)
MCHC: 34.6 g/dL (ref 30.0–36.0)
MCV: 84.6 fL (ref 78.0–100.0)
MONO ABS: 0.6 10*3/uL (ref 0.1–1.0)
Monocytes Relative: 7 % (ref 3–12)
Neutro Abs: 5.3 10*3/uL (ref 1.7–7.7)
Neutrophils Relative %: 68 % (ref 43–77)
Platelets: 277 10*3/uL (ref 150–400)
RBC: 4.1 MIL/uL (ref 3.87–5.11)
RDW: 11.7 % (ref 11.5–15.5)
WBC: 7.8 10*3/uL (ref 4.0–10.5)

## 2013-08-03 MED ORDER — PANTOPRAZOLE SODIUM 40 MG PO TBEC: 40.0000 mg | DELAYED_RELEASE_TABLET | Freq: Every day | ORAL | Status: DC

## 2013-08-03 NOTE — Progress Notes (Signed)
2 Days Post-Op  Subjective: Still feel a little tight in stomach but better than yesterday. [pain ok. No n/v. Tolerated water.   Objective: Vital signs in last 24 hours: Temp:  [98.4 F (36.9 C)-99.1 F (37.3 C)] 98.7 F (37.1 C) (05/20 0508) Pulse Rate:  [64-94] 92 (05/20 0508) Resp:  [16-18] 16 (05/20 0508) BP: (110-137)/(74-84) 128/74 mmHg (05/20 0508) SpO2:  [95 %-98 %] 95 % (05/20 0508) Last BM Date: 08/01/13  Intake/Output from previous day: 05/19 0701 - 05/20 0700 In: 3149.2 [P.O.:120; I.V.:3029.2] Out: 3150 [Urine:3150] Intake/Output this shift:    Alert, nad cta  Reg Obese, soft, expected mild TTp. Incisions c/d/i +SCDs  Lab Results:   Recent Labs  08/02/13 0350 08/02/13 1603 08/03/13 0538  WBC 10.3  --  7.8  HGB 12.2 12.1 12.0  HCT 34.9* 34.5* 34.7*  PLT 321  --  277   BMET  Recent Labs  08/02/13 0350  NA 139  K 3.6*  CL 102  CO2 25  GLUCOSE 116*  BUN 5*  CREATININE 0.68  CALCIUM 8.5   PT/INR No results found for this basename: LABPROT, INR,  in the last 72 hours ABG No results found for this basename: PHART, PCO2, PO2, HCO3,  in the last 72 hours  Studies/Results: Dg Ugi W/water Sol Cm  08/02/2013   CLINICAL DATA:  Status post Roux-en-Y gastric bypass.  EXAM: WATER SOLUBLE UPPER GI SERIES  TECHNIQUE: Single-column upper GI series was performed using water soluble contrast.  CONTRAST:  50mL OMNIPAQUE IOHEXOL 300 MG/ML  SOLN  COMPARISON:  05/06/2013 pre procedure fluoroscopy.  FLUOROSCOPY TIME:  1 min and 24 seconds  FINDINGS: Preprocedure scout film is unremarkable.  Focused, small volume contrast study. Expected appearance of the gastric remnant. No evidence of contrast extravasation to suggest perforation. Normal postoperative appearance of the Roux loop. The jejunal-jejunal anastomosis is not well visualized.  IMPRESSION: Expected appearance after Roux-en-Y gastric bypass.   Electronically Signed   By: Jeronimo GreavesKyle  Talbot M.D.   On: 08/02/2013 10:31     Anti-infectives: Anti-infectives   Start     Dose/Rate Route Frequency Ordered Stop   08/01/13 0838  cefOXitin (MEFOXIN) 2 g in dextrose 5 % 50 mL IVPB     2 g 100 mL/hr over 30 Minutes Intravenous On call to O.R. 08/01/13 0838 08/01/13 1030      Assessment/Plan: s/p Procedure(s): LAPAROSCOPIC ROUX-EN-Y GASTRIC BYPASS WITH UPPER ENDOSCOPY WITH   Looks good. No fever, tachycardia. Tolerated water.  Adv to POD 2 diet If does well, will dc to home later today Discussed d/c instructions  Mary Sellaric M. [name], MD, FACS General, Bariatric, & Minimally Invasive Surgery [facility] [name]   LOS: 2 days    Karen Mcintyre Inaric M Daci Stubbe 08/03/2013

## 2013-08-03 NOTE — Progress Notes (Signed)
Nutrition Education Note  Met with pt for diet education per DROP protocol.   Discussed 2 week post op diet with pt. Emphasized that liquids must be non carbonated, non caffeinated, and sugar free. Fluid goals discussed. Pt to follow up with outpatient bariatric RD for further diet progression after 2 weeks. Multivitamins and minerals also reviewed. Teach back method used, pt expressed understanding, expect good compliance.   Diet: First 2 Weeks  You will see the nutritionist about two (2) weeks after your surgery. The nutritionist will increase the types of foods you can eat if you are handling liquids well:  If you have severe vomiting or nausea and cannot handle clear liquids lasting longer than 1 day, call your surgeon  Protein Shake  Drink at least 2 ounces of shake 5-6 times per day  Each serving of protein shakes (usually 8 - 12 ounces) should have a minimum of:  15 grams of protein  And no more than 5 grams of carbohydrate  Goal for protein each day:  Men = 80 grams per day  Women = 60 grams per day  Protein powder may be added to fluids such as non-fat milk or Lactaid milk or Soy milk (limit to 35 grams added protein powder per serving)   Hydration  Slowly increase the amount of water and other clear liquids as tolerated (See Acceptable Fluids)  Slowly increase the amount of protein shake as tolerated  Sip fluids slowly and throughout the day  May use sugar substitutes in small amounts (no more than 6 - 8 packets per day; i.e. Splenda)   Fluid Goal  The first goal is to drink at least 8 ounces of protein shake/drink per day (or as directed by the nutritionist); some examples of protein shakes are [employer], [employer], EAS Edge HP, and Unjury. See handout from pre-op Bariatric Education Class:  Slowly increase the amount of protein shake you drink as tolerated  You may find it easier to slowly sip shakes throughout the day  It is important to get your proteins in  first  Your fluid goal is to drink 64 - 100 ounces of fluid daily  It may take a few weeks to build up to this  32 oz (or more) should be clear liquids  And  32 oz (or more) should be full liquids (see below for examples)  Liquids should not contain sugar, caffeine, or carbonation   Clear Liquids:  Water or Sugar-free flavored water (i.e. Fruit H2O, Propel)  Decaffeinated coffee or tea (sugar-free)  Crystal Lite, Wyler's Lite, Minute Maid Lite  Sugar-free Jell-O  Bouillon or broth  Sugar-free Popsicle: *Less than 20 calories each; Limit 1 per day   Full Liquids:  Protein Shakes/Drinks + 2 choices per day of other full liquids  Full liquids must be:  No More Than 12 grams of Carbs per serving  No More Than 3 grams of Fat per serving  Strained low-fat cream soup  Non-Fat milk  Fat-free Lactaid Milk  Sugar-free yogurt (Dannon Lite & Fit, [place] yogurt)     [place], [place], [place] Pager [phone] After Hours Pager

## 2013-08-03 NOTE — Progress Notes (Signed)
Patient alert and oriented, pain is controlled. Patient is tolerating fluids, plan to advance to protein shake today. Reviewed Gastric Bypass discharge instructions with patient and patient is able to articulate understanding. Provided information on BELT program, Support Group and [facility] outpatient pharmacy. All questions answered, will continue to monitor.  

## 2013-08-03 NOTE — Discharge Instructions (Signed)

## 2013-08-03 NOTE — Telephone Encounter (Signed)
Pt FML[employer] company called to verify pts last office visit, to verify pt had surgery and to ask if pt has been discharged. Pt still admitted so she will need to call back for any post operative questions.

## 2013-08-04 ENCOUNTER — Telehealth (HOSPITAL_COMMUNITY): Payer: Self-pay

## 2013-08-04 NOTE — Discharge Summary (Signed)
Physician Discharge Summary  Karen Mcintyre ZOX:[id number] DOB: 06/28/1990 DOA: 08/01/2013  PCP: No PCP Per Patient  Admit date: 08/01/2013 Discharge date: 08/03/2013  Recommendations for Outpatient Follow-up:  1.   Follow-up Information   Follow up with Atilano InaWILSON,Levar Fayson M, MD On 08/11/2013. (For wound re-check at 11 AM)    Specialty:  General Surgery   Contact information:   [address] [place] [address] [phone]      Discharge Diagnoses:  Active Problems:   Morbid obesity Bilateral knee pain Low back pain  Surgical Procedure: Laparoscopic Roux-en-Y gastric bypass, upper endoscopy 08/01/13  Discharge Condition: Good Disposition: Home  Diet recommendation: Postoperative gastric bypass diet  Filed Weights   08/01/13 0832 08/01/13 1515  Weight: 256 lb 6.4 oz (116.302 kg) 256 lb 6.4 oz (116.302 kg)     Hospital Course:  The patient was admitted for a planned laparoscopic Roux-en-Y gastric bypass. Please see operative note. Preoperatively the patient was given 5000 units of subcutaneous heparin for DVT prophylaxis. Postoperative prophylactic Lovenox dosing was started on the morning of postoperative day 1. The patient underwent an upper GI on postoperative day 1 which demonstrated no extravasation of contrast and emptying of the contrast into the Roux limb. The patient was started on ice chips and water which they tolerated. On postoperative day 2 The patient's diet was advanced to protein shakes which they also tolerated. The patient was ambulating without difficulty. Their vital signs are stable without fever or tachycardia. Their hemoglobin had remained stable.  The patient had received discharge instructions and counseling. They were deemed stable for discharge.   Discharge Instructions  Discharge Instructions   Discharge instructions    Complete by:  As directed   See bariatric discharge instructions     Increase activity slowly    Complete by:  As directed              Medication List         HAIR/SKIN/NAILS PO  Take 1 tablet by mouth daily.     multivitamin with minerals Tabs tablet  Take 1 tablet by mouth daily.     oxyCODONE 5 MG/5ML solution  Commonly known as:  ROXICODONE  Take 5-10 mLs (5-10 mg total) by mouth every 4 (four) hours as needed for severe pain.     pantoprazole 40 MG tablet  Commonly known as:  PROTONIX  Take 1 tablet (40 mg total) by mouth daily.           Follow-up Information   Follow up with Atilano InaWILSON,Lequan Dobratz M, MD On 08/11/2013. (For wound re-check at 11 AM)    Specialty:  General Surgery   Contact information:   [address] [place] [address] [phone]        The results of significant diagnostics from this hospitalization (including imaging, microbiology, ancillary and laboratory) are listed below for reference.    Significant Diagnostic Studies: Dg Ugi W/water Sol Cm  08/02/2013   CLINICAL DATA:  Status post Roux-en-Y gastric bypass.  EXAM: WATER SOLUBLE UPPER GI SERIES  TECHNIQUE: Single-column upper GI series was performed using water soluble contrast.  CONTRAST:  50mL OMNIPAQUE IOHEXOL 300 MG/ML  SOLN  COMPARISON:  05/06/2013 pre procedure fluoroscopy.  FLUOROSCOPY TIME:  1 min and 24 seconds  FINDINGS: Preprocedure scout film is unremarkable.  Focused, small volume contrast study. Expected appearance of the gastric remnant. No evidence of contrast extravasation to suggest perforation. Normal postoperative appearance of the Roux loop.  The jejunal-jejunal anastomosis is not well visualized.  IMPRESSION: Expected appearance after Roux-en-Y gastric bypass.   Electronically Signed   By: Jeronimo GreavesKyle  Talbot M.D.   On: 08/02/2013 10:31    Labs: Basic Metabolic Panel:  Recent Labs Lab 08/02/13 0350  NA 139  K 3.6*  CL 102  CO2 25  GLUCOSE 116*  BUN 5*  CREATININE 0.68  CALCIUM 8.5   Liver Function Tests:  Recent Labs Lab 08/02/13 0350  AST 32  ALT 33  ALKPHOS 55  BILITOT  0.3  PROT 6.4  ALBUMIN 3.3*    CBC:  Recent Labs Lab 08/01/13 1428 08/02/13 0350 08/02/13 1603 08/03/13 0538  WBC  --  10.3  --  7.8  NEUTROABS  --  7.5  --  5.3  HGB 13.1 12.2 12.1 12.0  HCT 37.6 34.9* 34.5* 34.7*  MCV  --  82.5  --  84.6  PLT  --  321  --  277    CBG: No results found for this basename: GLUCAP,  in the last 168 hours  Active Problems:   Morbid obesity   Time coordinating discharge: 15 minutes  Signed:  Atilano InaEric M Brynja Marker, MD [facility], [name] [phone] 08/04/2013, 8:52 AM

## 2013-08-09 ENCOUNTER — Encounter: Payer: BC Managed Care – PPO | Attending: General Surgery

## 2013-08-09 DIAGNOSIS — Z713 Dietary counseling and surveillance: Secondary | ICD-10-CM | POA: Insufficient documentation

## 2013-08-09 NOTE — Progress Notes (Signed)
Bariatric Class:  Appt start time: 1530 end time:  1630.  2 Week Post-Operative Nutrition Class  Patient was seen on 5/26/20105 for Post-Operative Nutrition education at [facility].   Surgery date: 08/01/13  Surgery type: RYGB  [facility] weight at [facility]: 261 on 05/19/13  Weight today: 249.5 lbs  Weight change: 14 lbs  TANITA BODY COMP RESULTS   06/27/13  08/09/13  BMI (kg/m^2)  45.9  43.5  Fat Mass (lbs)  134  132.0  Fat Free Mass (lbs)  129.5  117.5  Total Body Water (lbs)  95  86.0    The following the learning objectives were met by the patient during this course:  Identifies Phase 3A (Soft, High Proteins) Dietary Goals and will begin from 2 weeks post-operatively to 2 months post-operatively  Identifies appropriate sources of fluids and proteins   States protein recommendations and appropriate sources post-operatively  Identifies the need for appropriate texture modifications, mastication, and bite sizes when consuming solids  Identifies appropriate multivitamin and calcium sources post-operatively  Describes the need for physical activity post-operatively and will follow MD recommendations  States when to call healthcare provider regarding medication questions or post-operative complications  Handouts given during class include:  Phase 3A: Soft, High Protein Diet Handout  Follow-Up Plan: Patient will [facility] at [facility] in 6 weeks for 2 month post-op nutrition visit for diet advancement per MD.

## 2013-08-11 ENCOUNTER — Ambulatory Visit (INDEPENDENT_AMBULATORY_CARE_PROVIDER_SITE_OTHER): Payer: BC Managed Care – PPO | Admitting: General Surgery

## 2013-08-11 ENCOUNTER — Encounter (INDEPENDENT_AMBULATORY_CARE_PROVIDER_SITE_OTHER): Payer: Self-pay | Admitting: General Surgery

## 2013-08-11 VITALS — BP 130/76 | HR 95 | Temp 97.4°F | Resp 14 | Ht 63.0 in | Wt 248.0 lb

## 2013-08-11 DIAGNOSIS — Z9884 Bariatric surgery status: Secondary | ICD-10-CM | POA: Insufficient documentation

## 2013-08-11 NOTE — Patient Instructions (Signed)
Drink plenty of fluids Taking MiraLAX daily for constipation You can gently remove dermabond with petroleum jelly. Apply hydrocortisone ointment.  Strive to get up to 60 grams of protein a day  Eating techniques 20-20-20 (30-30-30) 20 chews, 20 seconds between bites of food, 20 minutes to eat; sometimes you may need 30 chews, 30 seconds etc Use your nondominant hand to eat with Put fork down between bites of food Use a timer after swallowing to reinforce waiting 20-30 sec between bites of food Use a child/infant size utensil Try not to eat while watching TV

## 2013-08-11 NOTE — Progress Notes (Signed)
Subjective:     Patient ID: Solon Augusta, female   DOB: [birth year], 24 y.o.   MRN: [id number]  HPI 23 year old Caucasian female comes in today for her first postoperative visit. 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 She was discharged to home on May 20. She states that she has been doing well. She states that she's been having a lot of itching around her incisions. She reports that her energy level is still not back to normal. She denies any significant abdominal pain. She has only had 2 small bowel movements since discharge. She denies any nausea or vomiting. She is tolerating her diet. Her diet was recently advanced. She has no difficulty swallowing or taking her supplements although she has found that the calcium chews are limited to sweet. She is now taking premium protein shakes as well as drinking water. She's also been experimenting with her [place]. And green beans and string cheese.  Review of Systems     Objective:   Physical Exam BP 130/76  Pulse 95  Temp(Src) 97.4 F (36.3 C)  Resp 14  Ht 5\' 3"  (1.6 m)  Wt 248 lb (112.492 kg)  BMI 43.94 kg/m2  LMP 06/29/2013  Gen: alert, NAD, non-toxic appearing Pupils: equal, no scleral icterus Pulm: Lungs clear to auscultation, symmetric chest rise CV: regular rate and rhythm Abd: soft, nontender, nondistended. Dermatitis at trocar sites. No cellulitis. No incisional hernia Ext: no edema, no calf tenderness Skin: no rash, no jaundice     Assessment:     Status post laparoscopic Roux-en-Y gastric bypass Bilateral knee pain Low back pain Contact dermatitis     Plan:     Overall I think she is doing well. Right now there is no clinical signs of complications. We discussed proper eating techniques and behaviors. I encouraged her to try soft proteins like fish. With respect to the itching around her trocar sites she clearly developed a contact dermatitis from the Dermabond. I've asked her to gently remove the  Dermabond with petroleum jelly. Advised her she could try hydrocortisone ointment over the trocar sites.  We also discussed importance of striving to get up to 64 ounces of liquid a day. This will help regulate her bowel movements. In the interim I have advised her to start taking MiraLAX on a daily basis. We discussed the importance of vitamin supplementation. I've asked her she could try different calcium citrate supplements.  Initial visit weight 258 pounds. Preoperative weight 263.6 pounds.  We discussed the importance of starting to have regular exercise such as walking. Followup with me in 4 week. She was given a note to extend her absence from work for another week  Mary Sella. Andrey Campanile, MD, FACS General, Bariatric, & Minimally Invasive Surgery [facility], [place]

## 2013-08-31 ENCOUNTER — Encounter (INDEPENDENT_AMBULATORY_CARE_PROVIDER_SITE_OTHER): Payer: Self-pay | Admitting: General Surgery

## 2013-09-01 ENCOUNTER — Telehealth (INDEPENDENT_AMBULATORY_CARE_PROVIDER_SITE_OTHER): Payer: Self-pay | Admitting: General Surgery

## 2013-09-01 NOTE — Telephone Encounter (Signed)
Attempted to contact patient via cell phone as well as land line. No answer on her cell phone. Left message on land Line for her to call office

## 2013-09-01 NOTE — Telephone Encounter (Signed)
I returned patient's phone call. She stated she had been having at least once a day episode of postprandial vomiting. It would occur by the time she would get through with her plate. She also was concerned she had not had a bowel movement in 2 weeks. She denies any fever, chills, abdominal pain. She is having flatus. She has been taking laxative pills to try to induce a bowel movement. She states that she took oral Dulcolax and had crampy pain with that and ended up having a bowel movement. She believes that she is not getting up to 60 g of protein a day. She has no problems tolerating liquids and is drinking plenty of liquids. She denies any lightheadedness or dizziness. She has not noticed any particular food intolerance. Given the lack of fever, chills, abdominal pain, and having flatus and tolerating liquids my suspicion for intra-abdominal issue is low. We rediscussed proper eating techniques. We discussed going back on some protein shakes and/or trying softer proteins like fish. I encouraged her to keep a food journal and documented which foods come back up. We discussed taking a liquid laxative such as MiraLAX and not taking pill forms. We also talked about potentially milk of magnesia. She is scheduled to see me next week. I advised her to contact me sooner if something changes or does not improve.

## 2013-09-12 NOTE — Telephone Encounter (Signed)
Attempted DROP discharge phone call, left message to return call

## 2013-09-15 ENCOUNTER — Ambulatory Visit (INDEPENDENT_AMBULATORY_CARE_PROVIDER_SITE_OTHER): Payer: BC Managed Care – PPO | Admitting: General Surgery

## 2013-09-15 ENCOUNTER — Encounter (INDEPENDENT_AMBULATORY_CARE_PROVIDER_SITE_OTHER): Payer: Self-pay | Admitting: General Surgery

## 2013-09-15 VITALS — BP 118/80 | HR 95 | Temp 98.4°F | Resp 16 | Ht 63.5 in | Wt 231.8 lb

## 2013-09-15 DIAGNOSIS — Z09 Encounter for follow-up examination after completed treatment for conditions other than malignant neoplasm: Secondary | ICD-10-CM

## 2013-09-15 NOTE — Patient Instructions (Signed)
Keep up good work Work on getting up to 50-60 grams of protein a day Continue with 64 oz of non-carbonated liquid a day Try an enema for constipation You have no restrictions now Work on increasing physical activity to 150 minutes a week

## 2013-09-15 NOTE — Progress Notes (Signed)
Subjective:     Patient ID: Karen Mcintyre, female   DOB: 07/18/1990, 23 y.o.   MRN: [id number]  HPI 23 year old female comes in for followup after undergoing laparoscopic Roux-en-Y gastric bypass surgery on May 18. I last saw her on May 28. Her weight at that time was 248 pounds. She states she is doing well. She denies any abdominal pain. She did have a few days of right shoulder and neck pain which has resolved. She denies any nausea or regurgitation. She denies any reflux. She denies any paresthesias. She states she is taking her supplements pretty much daily. Occasionally she may forget. She is walking 3 times a week with her mother. They're walking about 30 minutes at a time. She denies any melena or many daily. She is still having some ongoing constipation. She took a Phillips pill which did help. She is having a fair amount of flatulence. She reports good fetal choices. She is getting and 64 ounces of liquids a day. She doesn't believe she is at 50 g of protein just yet. She is sometimes struggling at the end of the work day with hunger. She denies any food intolerances  Review of Systems     Objective:   Physical Exam BP 118/80  Pulse 95  Temp(Src) 98.4 F (36.9 C)  Resp 16  Ht 5' 3.5" (1.613 m)  Wt 231 lb 12.8 oz (105.144 kg)  BMI 40.41 kg/m2  Gen: alert, NAD, non-toxic appearing Pupils: equal, no scleral icterus Pulm: Lungs clear to auscultation, symmetric chest rise CV: regular rate and rhythm Abd: soft, nontender, nondistended. Well-healed trocar sites. No cellulitis. No incisional hernia Ext: no edema, no calf tenderness Skin: no rash, no jaundice     Assessment:     Status post laparoscopic Roux-en-Y gastric bypass May 18     Plan:     Overall I think she is doing well. Her initial visit weight was 258 pounds. Her preoperative weight was 264 pounds. She is also total 32 pounds. I congratulated her on her weight loss. I did encourage her to increase her cardiovascular  activity to around 150 minutes a week. Her monitor she had no physical restrictions at this point. With respect or constipation though not terribly concerned. She does not have any additional symptoms therefore I think it'll gradually work its way out with time. I discussed the importance of daily multivitamin and supplement compliance. I encouraged her to use a pillbox or set reminders on her smartphone. She was given a note for work asking her to have a short break late in the afternoon. I believe the right shoulder and neck discomfort was more musculoskeletal in nature. I will see her back in about 8-10 weeks  Mary SellaEric M. [name], MD, FACS General, Bariatric, & Minimally Invasive Surgery [facility], [name]

## 2013-09-20 ENCOUNTER — Encounter: Payer: BC Managed Care – PPO | Attending: General Surgery | Admitting: Dietician

## 2013-09-20 DIAGNOSIS — Z713 Dietary counseling and surveillance: Secondary | ICD-10-CM | POA: Insufficient documentation

## 2013-09-20 NOTE — Progress Notes (Signed)
  Follow-up visit:  8 Weeks Post-Operative RYGB Surgery  Medical Nutrition Therapy:  Appt start time: 1730 end time:  1800.  Primary concerns today: Post-operative Bariatric Surgery Nutrition Management. Alek returns today with at 18.5 weight loss (25 fat loss). Has been feeling sick and vomiting about 1 x per day and was not sure why. Thinks she may have been having foods she shouldn't have yet. Starting to feel better overall, but still got sick today (first time in 2 weeks).   Having trouble getting protein in, getting tired of shakes and meats.   Surgery date: 08/01/13  Surgery type: RYGB  [facility]  Weight today: 231.0 lbs   Weight change: 18.5 lbs, 25 lbs fat loss  Total weight: 30 lbs  TANITA BODY COMP RESULTS   06/27/13  08/09/13 09/20/13  BMI (kg/m^2)  45.9  43.5 40.3  Fat Mass (lbs)  134  132.0 107.0  Fat Free Mass (lbs)  129.5  117.5 124.0  Total Body Water (lbs)  95  86.0 91    Preferred Learning Style:   No preference indicated   Learning Readiness:   Ready  24-hr recall: B (AM): yogurt Activia light (4 g) Snk (AM): iced coffee with protein (2-3 g)  L (PM): smithfield Malaw[place]urkey and cheese wraps (10 g) Snk (PM): 1 oz beef jerky (throughout the day) (15 g) D (PM): 2 oz chicken or beef (14 g) Snk (PM): snap pea crisps (5 g)  Fluid intake: iced coffee and water ~ 64 fl oz Estimated total protein intake: ~ 50 g  Medications: see list Supplementation: taking (missed a few days last week)  Using straws: just one time Drinking while eating: try not to unless feeling sick Hair loss: No Carbonated beverages: No N/V/D/C: nausea and vomiting is getting better, but was sick 1 x day, constipation - started taking Miralax Dumping syndrome: No  Recent physical activity:  Walk a few times per week, going to join a gym  Progress Towards Goal(s):  In progress.  Handouts given during visit include:  Phase 3B High Protein + Non Starchy  Vegetables   Nutritional Diagnosis:  [place]-3.3 Overweight/obesity related to past poor dietary habits and physical inactivity as evidenced by patient w/ recent RYGB surgery following dietary guidelines for continued weight loss.    Intervention:  Nutrition education/diet advancement.  Teaching Method Utilized:  Visual Auditory Hands on  Barriers to learning/adherence to lifestyle change: none  Demonstrated degree of understanding via:  Teach Back   Monitoring/Evaluation:  Dietary intake, exercise, and body weight. Follow up in 1 months for 3 month post-op visit.

## 2013-09-20 NOTE — Patient Instructions (Signed)
Goals:  Follow Phase 3B: High Protein + Non-Starchy Vegetables  Eat 3-6 small meals/snacks, every 3-5 hrs  Increase lean protein foods to meet 60g goal  Try adding unflavored protein powder to yogurt and coffee   Try coffee with skim milk and sugar free syrups  Increase fluid intake to 64oz +  Avoid drinking 15 minutes before, during and 30 minutes after eating  Aim for >30 min of physical activity daily

## 2013-10-26 ENCOUNTER — Encounter: Payer: BC Managed Care – PPO | Attending: General Surgery | Admitting: Dietician

## 2013-10-26 DIAGNOSIS — Z713 Dietary counseling and surveillance: Secondary | ICD-10-CM | POA: Insufficient documentation

## 2013-10-26 NOTE — Patient Instructions (Addendum)
Goals:  Follow Phase 3B: High Protein + Non-Starchy Vegetables  Eat 3-6 small meals/snacks, every 3-5 hrs  Increase lean protein foods to meet 60g goal  Look for sugar free coffee syrup at [employer] [employer] (on [address]?)  Increase fluid intake to 64oz +  Avoid drinking 15 minutes before, during and 30 minutes after eating  Aim for >30 min of physical activity daily  Try clear protein such as Isopure, Nectar, or add unflavored protein to add drinks

## 2013-10-26 NOTE — Progress Notes (Signed)
  Follow-up visit:  8 Weeks Post-Operative RYGB Surgery  Medical Nutrition Therapy:  Appt start time: 1725 end time:  1750.  Primary concerns today: Post-operative Bariatric Surgery Nutrition Management. Karen Mcintyre returns today with a 16 lbs weight loss. Has not been getting sick anymore but will feel nauseas if she eats too big of a bite. Still feels like she is not getting not protein. Feels like she is back to normal with her energy level.   Surgery date: 08/01/13  Surgery type: RYGB  [facility]  Weight today: 215 lbs   Weight change: 16 lbs Total weight: 46 lbs Weight loss goal: 145 lbs  TANITA BODY COMP RESULTS   06/27/13  08/09/13 09/20/13 10/26/13  BMI (kg/m^2)  45.9  43.5 40.3 37.5  Fat Mass (lbs)  134  132.0 107.0 93.0  Fat Free Mass (lbs)  129.5  117.5 124.0 122.0  Total Body Water (lbs)  95  86.0 91 89.5    Preferred Learning Style:   No preference indicated   Learning Readiness:   Ready  24-hr recall: B (AM): yogurt Activia light (4 g) or yoplait Greek (12g) Snk (AM): none L (PM): smithfield Malaw[place]urkey and cheese wraps (10 g) Snk (PM): 1 oz beef jerky (throughout the day) (15 g) D (PM): 2 oz chicken or beef or pork with beans (14 g) Snk (PM): none  Fluid intake: iced coffee and water and diet green tea ~ 64 fl oz Estimated total protein intake: ~ 50 g  Medications: see list Supplementation: taking   Using straws: No Drinking while eating: try not to unless feeling sick Hair loss: No Carbonated beverages: had one and it hurt her stomach N/V/D/C: nausea and vomiting is getting better, constipation - only going once every 3 weeks Dumping syndrome: No  Recent physical activity:  Walk a few times per week, DVDs about 2 x week going to join a gym  Progress Towards Goal(s):  In progress.  Supplements given during visit include:  Unflavored Unjury, lot # [id number], exp: 12/2014  Celebrate Protein Shake, lot #[id number], exp 06/17  Nectar Grape,  lot # [id number], exp: 1/17   Nutritional Diagnosis:  [place]-3.3 Overweight/obesity related to past poor dietary habits and physical inactivity as evidenced by patient w/ recent RYGB surgery following dietary guidelines for continued weight loss.    Intervention:  Nutrition education/diet enforcement.  Teaching Method Utilized:  Visual Auditory Hands on  Barriers to learning/adherence to lifestyle change: none  Demonstrated degree of understanding via:  Teach Back   Monitoring/Evaluation:  Dietary intake, exercise, and body weight. Follow up in 3 months for 6 month post-op visit.

## 2013-11-17 ENCOUNTER — Ambulatory Visit (INDEPENDENT_AMBULATORY_CARE_PROVIDER_SITE_OTHER): Payer: BC Managed Care – PPO | Admitting: General Surgery

## 2013-11-17 ENCOUNTER — Other Ambulatory Visit (INDEPENDENT_AMBULATORY_CARE_PROVIDER_SITE_OTHER): Payer: Self-pay | Admitting: General Surgery

## 2013-11-17 DIAGNOSIS — Z9884 Bariatric surgery status: Secondary | ICD-10-CM

## 2013-12-03 ENCOUNTER — Other Ambulatory Visit (INDEPENDENT_AMBULATORY_CARE_PROVIDER_SITE_OTHER): Payer: Self-pay | Admitting: General Surgery

## 2013-12-03 LAB — CMP AND LIVER
ALT: 10 U/L (ref 0–35)
AST: 14 U/L (ref 0–37)
Albumin: 4.1 g/dL (ref 3.5–5.2)
Alkaline Phosphatase: 49 U/L (ref 39–117)
BILIRUBIN DIRECT: 0.1 mg/dL (ref 0.0–0.3)
BILIRUBIN INDIRECT: 0.4 mg/dL (ref 0.2–1.2)
BUN: 8 mg/dL (ref 6–23)
CO2: 26 meq/L (ref 19–32)
CREATININE: 0.61 mg/dL (ref 0.50–1.10)
Calcium: 9.5 mg/dL (ref 8.4–10.5)
Chloride: 104 mEq/L (ref 96–112)
GLUCOSE: 74 mg/dL (ref 70–99)
Potassium: 4.4 mEq/L (ref 3.5–5.3)
Sodium: 140 mEq/L (ref 135–145)
Total Bilirubin: 0.5 mg/dL (ref 0.2–1.2)
Total Protein: 6.6 g/dL (ref 6.0–8.3)

## 2013-12-03 LAB — CBC WITH DIFFERENTIAL/PLATELET
Basophils Absolute: 0.1 10*3/uL (ref 0.0–0.1)
Basophils Relative: 1 % (ref 0–1)
Eosinophils Absolute: 0.1 10*3/uL (ref 0.0–0.7)
Eosinophils Relative: 2 % (ref 0–5)
HCT: 40.5 % (ref 36.0–46.0)
HEMOGLOBIN: 13.7 g/dL (ref 12.0–15.0)
LYMPHS ABS: 1.9 10*3/uL (ref 0.7–4.0)
LYMPHS PCT: 33 % (ref 12–46)
MCH: 29.4 pg (ref 26.0–34.0)
MCHC: 33.8 g/dL (ref 30.0–36.0)
MCV: 86.9 fL (ref 78.0–100.0)
MONOS PCT: 9 % (ref 3–12)
Monocytes Absolute: 0.5 10*3/uL (ref 0.1–1.0)
NEUTROS ABS: 3.1 10*3/uL (ref 1.7–7.7)
NEUTROS PCT: 55 % (ref 43–77)
Platelets: 320 10*3/uL (ref 150–400)
RBC: 4.66 MIL/uL (ref 3.87–5.11)
RDW: 13.8 % (ref 11.5–15.5)
WBC: 5.7 10*3/uL (ref 4.0–10.5)

## 2013-12-03 LAB — IRON AND TIBC
%SAT: 25 % (ref 20–55)
Iron: 68 ug/dL (ref 42–145)
TIBC: 274 ug/dL (ref 250–470)
UIBC: 206 ug/dL (ref 125–400)

## 2013-12-03 LAB — VITAMIN B12: VITAMIN B 12: 715 pg/mL (ref 211–911)

## 2013-12-06 LAB — VITAMIN D 25 HYDROXY (VIT D DEFICIENCY, FRACTURES): VIT D 25 HYDROXY: 42 ng/mL (ref 30–89)

## 2013-12-08 LAB — VITAMIN B1: VITAMIN B1 (THIAMINE): 10 nmol/L (ref 8–30)

## 2013-12-14 ENCOUNTER — Telehealth (INDEPENDENT_AMBULATORY_CARE_PROVIDER_SITE_OTHER): Payer: Self-pay

## 2013-12-14 NOTE — Telephone Encounter (Signed)
LMOV to call for lab results.  Bariatric labs all normal.

## 2014-01-26 ENCOUNTER — Ambulatory Visit: Payer: BC Managed Care – PPO | Admitting: Dietician

## 2014-03-06 ENCOUNTER — Encounter: Payer: BC Managed Care – PPO | Attending: General Surgery | Admitting: Dietician

## 2014-03-06 DIAGNOSIS — Z713 Dietary counseling and surveillance: Secondary | ICD-10-CM | POA: Insufficient documentation

## 2014-03-06 DIAGNOSIS — Z6829 Body mass index (BMI) 29.0-29.9, adult: Secondary | ICD-10-CM | POA: Insufficient documentation

## 2014-03-06 NOTE — Progress Notes (Signed)
  Follow-up visit:  7 Months Post-Operative RYGB Surgery  Medical Nutrition Therapy:  Appt start time: 1735 end time:  1810.  Primary concerns today: Post-operative Bariatric Surgery Nutrition Management. Returns with a 46 lb weight loss. Has been having hair loss and is concern that it is nutrition related. Reports that she is not always good about taking her vitamins. Feeling good with most foods. Some foods might make her sick and those foods aren't always the same ones. Might be eating too quickly.   Body fat % is at 31.7% today. Discussed healthy body fat ranges and suggested that she might not need to lose as much weight as she originally planned to. If she does not lose any more weight, she is still in the healthy body fat range.   Using My Fitness Pal to track foods.   Surgery date: 08/01/13  Surgery type: RYGB  [facility]  Weight today: 169 lbs   Weight change: 46 lbs Total weight: 92 lbs Weight loss goal: 145 lbs  TANITA BODY COMP RESULTS   06/27/13  08/09/13 09/20/13 10/26/13 03/06/14  BMI (kg/m^2)  45.9  43.5 40.3 37.5 29.5  Fat Mass (lbs)  134  132.0 107.0 93.0 53.5  Fat Free Mass (lbs)  129.5  117.5 124.0 122.0 115.5  Total Body Water (lbs)  95  86.0 91 89.5 84.5    Preferred Learning Style:   No preference indicated   Learning Readiness:   Ready  24-hr recall: B (AM): yogurt Activia light (4 g) or yoplait Greek (12g) Snk (AM): none L (PM): chicken salad or protein and vegetable (10-15 g) Snk (PM): [facility] Protein bar (10 g) D (PM): 3 oz chicken or beef or pork with vegetables (21 g) Snk (PM): none  Fluid intake: water and and Diet Mountain Dew sometimes ~ 64 fl oz Estimated total protein intake: 50-60 g  Medications: see list Supplementation: taking 1 MVI and B12 (forgets to take more) and is not taking calcium  Using straws: No Drinking while eating: sometimes Hair loss: Yes - slowing down Carbonated beverages: Diet Mountain  Dew - though does hurt her stomach N/V/D/C: constipation - started taking Pro biotic again Dumping syndrome: No  Recent physical activity:  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   Nutritional Diagnosis:  [place]-3.3 Overweight/obesity related to past poor dietary habits and physical inactivity as evidenced by patient w/ recent RYGB surgery following dietary guidelines for continued weight loss.    Intervention:  Nutrition education/diet enforcement.  Teaching Method Utilized:  Visual Auditory Hands on  Barriers to learning/adherence to lifestyle change: none  Demonstrated degree of understanding via:  Teach Back   Monitoring/Evaluation:  Dietary intake, exercise, and body weight. Follow up in 6 months for 12-13 month post-op visit.

## 2014-03-06 NOTE — Patient Instructions (Addendum)
Goals:  Follow Phase 3B: High Protein + Non-Starchy Vegetables  Eat 3-6 small meals/snacks, every 3-5 hrs  Increase lean protein foods to meet 60g goal  Increase fluid intake to 64oz +  Avoid drinking 15 minutes before, during and 30 minutes after eating  Aim for >30 min of physical activity daily after the holidays (try adding weights)  Try [employer] Protein Chips again, edamame, plain greek yogurt (in place of sour cream) or with ranch dressing packet  Eat protein first, then vegetables, and add carbs if you are still hungry  Surgery date: 08/01/13  Surgery type: RYGB  [facility]: 261 on 05/19/13  Weight today: 169 lbs   Weight change: 46 lbs Total weight: 92 lbs Weight loss goal: 145 lbs  TANITA BODY COMP RESULTS   06/27/13  08/09/13 09/20/13 10/26/13 03/06/14  BMI (kg/m^2)  45.9  43.5 40.3 37.5 29.5  Fat Mass (lbs)  134  132.0 107.0 93.0 53.5  Fat Free Mass (lbs)  129.5  117.5 124.0 122.0 115.5  Total Body Water (lbs)  95  86.0 91 89.5 84.5

## 2014-03-17 DIAGNOSIS — R1013 Epigastric pain: Secondary | ICD-10-CM

## 2014-03-17 HISTORY — DX: Epigastric pain: R10.13

## 2014-04-02 ENCOUNTER — Emergency Department (HOSPITAL_BASED_OUTPATIENT_CLINIC_OR_DEPARTMENT_OTHER): Payer: BLUE CROSS/BLUE SHIELD

## 2014-04-02 ENCOUNTER — Encounter (HOSPITAL_BASED_OUTPATIENT_CLINIC_OR_DEPARTMENT_OTHER): Payer: Self-pay | Admitting: *Deleted

## 2014-04-02 ENCOUNTER — Emergency Department (HOSPITAL_BASED_OUTPATIENT_CLINIC_OR_DEPARTMENT_OTHER)
Admission: EM | Admit: 2014-04-02 | Discharge: 2014-04-02 | Disposition: A | Discharge status: Home / Self Care | Payer: BLUE CROSS/BLUE SHIELD | Attending: Emergency Medicine | Admitting: Emergency Medicine

## 2014-04-02 DIAGNOSIS — Z9884 Bariatric surgery status: Secondary | ICD-10-CM | POA: Insufficient documentation

## 2014-04-02 DIAGNOSIS — R1013 Epigastric pain: Secondary | ICD-10-CM | POA: Insufficient documentation

## 2014-04-02 DIAGNOSIS — Z3202 Encounter for pregnancy test, result negative: Secondary | ICD-10-CM | POA: Insufficient documentation

## 2014-04-02 DIAGNOSIS — R110 Nausea: Secondary | ICD-10-CM | POA: Insufficient documentation

## 2014-04-02 DIAGNOSIS — Z79899 Other long term (current) drug therapy: Secondary | ICD-10-CM | POA: Insufficient documentation

## 2014-04-02 LAB — CBC WITH DIFFERENTIAL/PLATELET
BASOS PCT: 0 % (ref 0–1)
Basophils Absolute: 0 10*3/uL (ref 0.0–0.1)
EOS ABS: 0.1 10*3/uL (ref 0.0–0.7)
Eosinophils Relative: 2 % (ref 0–5)
HEMATOCRIT: 37.6 % (ref 36.0–46.0)
HEMOGLOBIN: 13 g/dL (ref 12.0–15.0)
Lymphocytes Relative: 35 % (ref 12–46)
Lymphs Abs: 1.9 10*3/uL (ref 0.7–4.0)
MCH: 30 pg (ref 26.0–34.0)
MCHC: 34.6 g/dL (ref 30.0–36.0)
MCV: 86.6 fL (ref 78.0–100.0)
Monocytes Absolute: 0.5 10*3/uL (ref 0.1–1.0)
Monocytes Relative: 9 % (ref 3–12)
NEUTROS ABS: 2.8 10*3/uL (ref 1.7–7.7)
Neutrophils Relative %: 54 % (ref 43–77)
PLATELETS: 255 10*3/uL (ref 150–400)
RBC: 4.34 MIL/uL (ref 3.87–5.11)
RDW: 11.4 % — ABNORMAL LOW (ref 11.5–15.5)
WBC: 5.2 10*3/uL (ref 4.0–10.5)

## 2014-04-02 LAB — COMPREHENSIVE METABOLIC PANEL
ALT: 10 U/L (ref 0–35)
ANION GAP: 5 (ref 5–15)
AST: 15 U/L (ref 0–37)
Albumin: 4 g/dL (ref 3.5–5.2)
Alkaline Phosphatase: 46 U/L (ref 39–117)
BILIRUBIN TOTAL: 0.4 mg/dL (ref 0.3–1.2)
BUN: 10 mg/dL (ref 6–23)
CALCIUM: 8.8 mg/dL (ref 8.4–10.5)
CO2: 28 mmol/L (ref 19–32)
Chloride: 105 mEq/L (ref 96–112)
Creatinine, Ser: 0.53 mg/dL (ref 0.50–1.10)
Glucose, Bld: 91 mg/dL (ref 70–99)
POTASSIUM: 3.4 mmol/L — AB (ref 3.5–5.1)
Sodium: 138 mmol/L (ref 135–145)
Total Protein: 6.7 g/dL (ref 6.0–8.3)

## 2014-04-02 LAB — URINALYSIS, ROUTINE W REFLEX MICROSCOPIC
Glucose, UA: NEGATIVE mg/dL
Hgb urine dipstick: NEGATIVE
Ketones, ur: 15 mg/dL — AB
Leukocytes, UA: NEGATIVE
Nitrite: NEGATIVE
PH: 6.5 (ref 5.0–8.0)
Protein, ur: NEGATIVE mg/dL
Specific Gravity, Urine: 1.028 (ref 1.005–1.030)
UROBILINOGEN UA: 2 mg/dL — AB (ref 0.0–1.0)

## 2014-04-02 LAB — LIPASE, BLOOD: LIPASE: 24 U/L (ref 11–59)

## 2014-04-02 LAB — PREGNANCY, URINE: PREG TEST UR: NEGATIVE

## 2014-04-02 MED ORDER — ONDANSETRON HCL 4 MG/2ML IJ SOLN
4.0000 mg | Freq: Once | INTRAMUSCULAR | Status: AC
  Administered 2014-04-02: 4 mg via INTRAVENOUS
  Filled 2014-04-02: qty 2

## 2014-04-02 MED ORDER — OMEPRAZOLE 20 MG PO CPDR: 20.0000 mg | DELAYED_RELEASE_CAPSULE | Freq: Every day | ORAL | Status: DC

## 2014-04-02 MED ORDER — HYDROMORPHONE HCL 1 MG/ML IJ SOLN
0.5000 mg | INTRAMUSCULAR | Status: DC | PRN
  Administered 2014-04-02: 0.5 mg via INTRAVENOUS
  Filled 2014-04-02: qty 1

## 2014-04-02 MED ORDER — SODIUM CHLORIDE 0.9 % IV SOLN: 1000.0000 mL | INTRAVENOUS | Status: DC

## 2014-04-02 MED ORDER — ONDANSETRON HCL 4 MG PO TABS
4.0000 mg | ORAL_TABLET | Freq: Four times a day (QID) | ORAL | Status: DC
Start: 2014-04-02 — End: 2014-05-31

## 2014-04-02 MED ORDER — SODIUM CHLORIDE 0.9 % IV SOLN
1000.0000 mL | Freq: Once | INTRAVENOUS | Status: AC
  Administered 2014-04-02: 1000 mL via INTRAVENOUS

## 2014-04-02 NOTE — ED Notes (Signed)
MD at bedside. 

## 2014-04-02 NOTE — ED Notes (Signed)
Patient c/o R side abd pain since Friday night, no nausea/vomiting

## 2014-04-02 NOTE — ED Notes (Signed)
rx x 2 given - family is coming to pick pt up

## 2014-04-02 NOTE — ED Notes (Signed)
MD at bedside for reassessment

## 2014-04-02 NOTE — ED Provider Notes (Signed)
CSN: [id number]     Arrival date & time 04/02/14  1006 History   First MD Initiated Contact with Patient 04/02/14 1017     Chief Complaint  Patient presents with  . Abdominal Pain     HPI Comments: She saw her doctor last week.  She had been having some random cramping  Patient is a 24 y.o. female presenting with abdominal pain.  Abdominal Pain Pain location:  Epigastric Pain quality: aching   Pain quality comment:  Tghtness Pain radiates to:  Epigastric region and RUQ Pain severity:  Moderate Duration:  2 days Timing:  Constant Progression:  Worsening Relieved by:  Nothing Worsened by:  Eating Associated symptoms: nausea   Associated symptoms: no constipation, no cough, no dysuria, no fever, no vaginal bleeding and no vomiting     Past Medical History  Diagnosis Date  . Obesity   . Obesity    Past Surgical History  Procedure Laterality Date  . Wisdom teeth extraction    . 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 Hyperlipidemia Mother   . Alcohol abuse Father   . Cancer Paternal Grandmother     lung cancer  . Obesity Other    History  Substance Use Topics  . Smoking status: Never Smoker   . Smokeless tobacco: Never Used  . Alcohol Use: No   OB History    No data available     Review of Systems  Constitutional: Negative for fever.  Respiratory: Negative for cough.   Gastrointestinal: Positive for nausea and abdominal pain. Negative for vomiting and constipation.  Genitourinary: Negative for dysuria and vaginal bleeding.  All other systems reviewed and are negative.     Allergies  Diphenhydramine hcl and Nitrofurantoin  Home Medications   Prior to Admission medications   Medication Sig Start Date End Date  Taking? Authorizing Provider  Multiple Vitamin (MULTIVITAMIN WITH MINERALS) TABS tablet Take 1 tablet by mouth daily.    Historical Provider, MD  Multiple Vitamins-Minerals (HAIR/SKIN/NAILS PO) Take 1 tablet by mouth daily.    Historical Provider, MD  omeprazole (PRILOSEC) 20 MG capsule Take 1 capsule (20 mg total) by mouth daily. 04/02/14   Linwood Dibbles, MD  ondansetron (ZOFRAN) 4 MG tablet Take 1 tablet (4 mg total) by mouth every 6 (six) hours. 04/02/14   Linwood Dibbles, MD  pantoprazole (PROTONIX) 40 MG tablet Take 1 tablet (40 mg total) by mouth daily. 08/03/13   Atilano Ina, MD   BP 101/60 mmHg  Pulse 52  Temp(Src) 98 F (36.7 C) (Oral)  Resp 16  Ht  (1.6 m)  Wt 158 lb (71.668 kg)  BMI 28.00 kg/m2  SpO2 98%  LMP 03/19/2014 Physical Exam  Constitutional: She appears well-developed and well-nourished. No distress.  HENT:  Head: Normocephalic and atraumatic.  Right Ear: External ear normal.  Left Ear: External ear normal.  Eyes: Conjunctivae are normal. Right eye exhibits no discharge. Left eye exhibits no discharge. No scleral icterus.  Neck: Neck supple. No tracheal deviation present.  Cardiovascular: Normal rate, regular rhythm and intact distal pulses.   Pulmonary/Chest: Effort normal and breath sounds normal. No stridor. No respiratory distress. She has no wheezes. She has no rales.  Abdominal: Soft. Bowel sounds are normal. She  exhibits no distension and no mass. There is tenderness in the epigastric area. There is no rigidity, no rebound, no guarding and negative Murphy's sign. No hernia.  Musculoskeletal: She exhibits no edema or tenderness.  Neurological: She is alert. She has normal strength. No cranial nerve deficit (no facial droop, extraocular movements intact, no slurred speech) or sensory deficit. She exhibits normal muscle tone. She displays no seizure activity. Coordination normal.  Skin: Skin is warm and dry. No rash noted.  Psychiatric: She has a normal mood and  affect.  Nursing note and vitals reviewed.   ED Course  Procedures (including critical care time) Labs Review Labs Reviewed  URINALYSIS, ROUTINE W REFLEX MICROSCOPIC - Abnormal; Notable for the following:    APPearance CLOUDY (*)    Bilirubin Urine SMALL (*)    Ketones, ur 15 (*)    Urobilinogen, UA 2.0 (*)    All other components within normal limits  CBC WITH DIFFERENTIAL - Abnormal; Notable for the following:    RDW 11.4 (*)    All other components within normal limits  COMPREHENSIVE METABOLIC PANEL - Abnormal; Notable for the following:    Potassium 3.4 (*)    All other components within normal limits  PREGNANCY, URINE  LIPASE, BLOOD    Imaging Review [place] Abdomen Complete  04/02/2014   CLINICAL DATA:  Initial encounter for epigastric abdominal pain that radiates to right upper quadrant for 2 days.  EXAM: ULTRASOUND ABDOMEN COMPLETE  COMPARISON:  Plain films earlier today.  Ultrasound 05/06/2013.  FINDINGS: Gallbladder: No gallstones or wall thickening visualized. No sonographic Murphy sign noted.  Common bile duct: Diameter: Normal, 2 mm.  Liver: No focal lesion identified. Within normal limits in parenchymal echogenicity.  IVC: No abnormality visualized.  Pancreas: Poorly visualized due to overlying bowel gas.  Spleen: Size and appearance within normal limits.  Right Kidney: Length: 10.9 cm. Echogenicity within normal limits. No mass or hydronephrosis visualized.  Left Kidney: Length: 11.6 cm. Echogenicity within normal limits. No mass or hydronephrosis visualized.  Abdominal aorta: No aneurysm visualized.  Other findings: No ascites.  Exam mildly degraded by overlying bowel gas.  IMPRESSION: 1.  No acute abdominal process. 2. Mild degradation secondary to overlying bowel gas.   Electronically Signed   By: Jeronimo GreavesKyle  Talbot M.D.   On: 04/02/2014 13:03   Dg Abd Acute W/chest  04/02/2014   CLINICAL DATA:  Epigastric abdominal pain for the past 3 days. Status post gastric bypass on  08/01/2013.  EXAM: ACUTE ABDOMEN SERIES (ABDOMEN 2 VIEW & CHEST 1 VIEW)  COMPARISON:  Chest radiographs dated 05/06/2013.  FINDINGS: Normal sized heart. Clear lungs. Normal bowel gas pattern without free peritoneal air. Surgical staples in the medial aspect of the left upper abdomen. Unremarkable bones.  IMPRESSION: No acute abnormality.   Electronically Signed   By: Gordan PaymentSteve  Reid M.D.   On: 04/02/2014 11:08   Medications  0.9 %  sodium chloride infusion (0 mLs Intravenous Stopped 04/02/14 1157)    Followed by  0.9 %  sodium chloride infusion (not administered)  HYDROmorphone (DILAUDID) injection 0.5 mg (0.5 mg Intravenous Given 04/02/14 1105)  ondansetron (ZOFRAN) injection 4 mg (4 mg Intravenous Given 04/02/14 1102)     MDM   Final diagnoses:  Abdominal pain, epigastric    Plain films do not show obstruction.  She is not having any trouble with vomiting.  Do not feel ct scan is necessary at this time.  [place] negative for gallbladder pathology.  Dc home with  antacids and antinausea meds.  Follow up with her surgeon.  Return for vomiting, fever, worsening symptoms  Linwood Dibbles, MD 04/02/14 1327

## 2014-04-02 NOTE — Discharge Instructions (Signed)

## 2014-04-07 ENCOUNTER — Encounter (INDEPENDENT_AMBULATORY_CARE_PROVIDER_SITE_OTHER): Payer: Self-pay | Admitting: General Surgery

## 2014-04-07 ENCOUNTER — Telehealth (INDEPENDENT_AMBULATORY_CARE_PROVIDER_SITE_OTHER): Payer: Self-pay | Admitting: General Surgery

## 2014-04-07 NOTE — Telephone Encounter (Signed)
Tried to return pt's phone from the other day after she apparently went to the ed with severe abd pain. Unable to reach pt. Sent her message in MyChart that if she was still having severe pain to go to ED and to call our office to speak with on call surgeon.  Could be ulcer but needs to be ruled out for internal hernia.

## 2014-04-10 ENCOUNTER — Encounter (HOSPITAL_COMMUNITY): Payer: Self-pay | Admitting: Emergency Medicine

## 2014-04-10 ENCOUNTER — Ambulatory Visit (INDEPENDENT_AMBULATORY_CARE_PROVIDER_SITE_OTHER): Payer: Self-pay | Admitting: General Surgery

## 2014-04-10 ENCOUNTER — Emergency Department (HOSPITAL_COMMUNITY)
Admission: EM | Admit: 2014-04-10 | Discharge: 2014-04-10 | Disposition: A | Discharge status: Home / Self Care | Payer: BLUE CROSS/BLUE SHIELD | Attending: Emergency Medicine | Admitting: Emergency Medicine

## 2014-04-10 ENCOUNTER — Emergency Department (HOSPITAL_COMMUNITY): Payer: BLUE CROSS/BLUE SHIELD

## 2014-04-10 DIAGNOSIS — R1013 Epigastric pain: Secondary | ICD-10-CM | POA: Diagnosis not present

## 2014-04-10 DIAGNOSIS — Z9884 Bariatric surgery status: Secondary | ICD-10-CM

## 2014-04-10 DIAGNOSIS — E669 Obesity, unspecified: Secondary | ICD-10-CM | POA: Diagnosis not present

## 2014-04-10 DIAGNOSIS — Z79899 Other long term (current) drug therapy: Secondary | ICD-10-CM | POA: Diagnosis not present

## 2014-04-10 DIAGNOSIS — Z3202 Encounter for pregnancy test, result negative: Secondary | ICD-10-CM | POA: Diagnosis not present

## 2014-04-10 DIAGNOSIS — R109 Unspecified abdominal pain: Secondary | ICD-10-CM | POA: Diagnosis present

## 2014-04-10 LAB — CBC WITH DIFFERENTIAL/PLATELET
Basophils Absolute: 0 10*3/uL (ref 0.0–0.1)
Basophils Relative: 0 % (ref 0–1)
EOS ABS: 0.1 10*3/uL (ref 0.0–0.7)
EOS PCT: 1 % (ref 0–5)
HEMATOCRIT: 40.1 % (ref 36.0–46.0)
Hemoglobin: 14 g/dL (ref 12.0–15.0)
LYMPHS PCT: 28 % (ref 12–46)
Lymphs Abs: 2 10*3/uL (ref 0.7–4.0)
MCH: 30 pg (ref 26.0–34.0)
MCHC: 34.9 g/dL (ref 30.0–36.0)
MCV: 85.9 fL (ref 78.0–100.0)
MONO ABS: 0.4 10*3/uL (ref 0.1–1.0)
Monocytes Relative: 6 % (ref 3–12)
NEUTROS PCT: 65 % (ref 43–77)
Neutro Abs: 4.7 10*3/uL (ref 1.7–7.7)
PLATELETS: 318 10*3/uL (ref 150–400)
RBC: 4.67 MIL/uL (ref 3.87–5.11)
RDW: 11.7 % (ref 11.5–15.5)
WBC: 7.3 10*3/uL (ref 4.0–10.5)

## 2014-04-10 LAB — URINALYSIS, ROUTINE W REFLEX MICROSCOPIC
Glucose, UA: NEGATIVE mg/dL
HGB URINE DIPSTICK: NEGATIVE
Ketones, ur: 15 mg/dL — AB
Leukocytes, UA: NEGATIVE
Nitrite: NEGATIVE
PROTEIN: NEGATIVE mg/dL
SPECIFIC GRAVITY, URINE: 1.041 — AB (ref 1.005–1.030)
Urobilinogen, UA: 1 mg/dL (ref 0.0–1.0)
pH: 5 (ref 5.0–8.0)

## 2014-04-10 LAB — COMPREHENSIVE METABOLIC PANEL
ALT: 13 U/L (ref 0–35)
AST: 16 U/L (ref 0–37)
Albumin: 4.7 g/dL (ref 3.5–5.2)
Alkaline Phosphatase: 50 U/L (ref 39–117)
Anion gap: 9 (ref 5–15)
BUN: 15 mg/dL (ref 6–23)
CO2: 25 mmol/L (ref 19–32)
CREATININE: 0.67 mg/dL (ref 0.50–1.10)
Calcium: 9.3 mg/dL (ref 8.4–10.5)
Chloride: 103 mmol/L (ref 96–112)
GFR calc non Af Amer: >90 mL/min (ref >90)
Glucose, Bld: 91 mg/dL (ref 70–99)
POTASSIUM: 3.8 mmol/L (ref 3.5–5.1)
SODIUM: 137 mmol/L (ref 135–145)
Total Bilirubin: 0.5 mg/dL (ref 0.3–1.2)
Total Protein: 7.6 g/dL (ref 6.0–8.3)

## 2014-04-10 LAB — LIPASE, BLOOD: Lipase: 25 U/L (ref 11–59)

## 2014-04-10 LAB — POC URINE PREG, ED: Preg Test, Ur: NEGATIVE

## 2014-04-10 MED ORDER — SODIUM CHLORIDE 0.9 % IV BOLUS (SEPSIS)
500.0000 mL | Freq: Once | INTRAVENOUS | Status: AC
  Administered 2014-04-10: 500 mL via INTRAVENOUS

## 2014-04-10 MED ORDER — MORPHINE SULFATE 4 MG/ML IJ SOLN
4.0000 mg | Freq: Once | INTRAMUSCULAR | Status: AC
  Administered 2014-04-10: 4 mg via INTRAVENOUS
  Filled 2014-04-10: qty 1

## 2014-04-10 MED ORDER — IOHEXOL 300 MG/ML  SOLN
25.0000 mL | Freq: Once | INTRAMUSCULAR | Status: AC | PRN
  Administered 2014-04-10: 25 mL via ORAL

## 2014-04-10 MED ORDER — ONDANSETRON HCL 4 MG/2ML IJ SOLN
4.0000 mg | Freq: Once | INTRAMUSCULAR | Status: AC
  Administered 2014-04-10: 4 mg via INTRAVENOUS
  Filled 2014-04-10: qty 2

## 2014-04-10 MED ORDER — IOHEXOL 300 MG/ML  SOLN
80.0000 mL | Freq: Once | INTRAMUSCULAR | Status: AC | PRN
  Administered 2014-04-10: 80 mL via INTRAVENOUS

## 2014-04-10 NOTE — H&P (Signed)
Karen Mcintyre is an 24 y.o. female.   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 It was constant and persistent which prompted her to go to the ED on 1/17 where an abd u/s and plain films were done along with labs which were normal and she was sent home. The soreness persisted, it was constant, it would occasionally worsen with PO. We were able to contact her today and advised her to come to the ED to get checked out. No NSAIDS. Sometimes standing straight up worsens it, curling up makes it better. Mild nausea. No emesis. +flatus and BM. No fever/chills/melena/hematochezia.   Past Medical History  Diagnosis Date  . Obesity   . Obesity     Past Surgical History  Procedure Laterality Date  . Wisdom teeth extraction    . Gastric roux-en-y N/A 08/01/2013    Procedure: LAPAROSCOPIC ROUX-EN-Y GASTRIC BYPASS WITH UPPER ENDOSCOPY WITH LAPAROSCOPIC REPAIR OF HIATAL HERNIA;  Surgeon: Gayland Curry, MD;  Location: [facility];  Service: General;  Laterality: N/A;    Family History  Problem Relation Age of Onset  . Hyperlipidemia Mother   . Alcohol abuse Father   . Cancer Paternal Grandmother     lung cancer  . Obesity Other    Social History:  reports that she has never smoked. She has never used smokeless tobacco. She reports that she does not drink alcohol or use illicit drugs.  Allergies:  Allergies  Allergen Reactions  . Diphenhydramine Hcl     REACTION: urticaria (hives)  . Nitrofurantoin     REACTION: unspecified     (Not in a hospital admission)  Results for orders placed or performed during the hospital encounter of 04/10/14 (from the past 48 hour(s))  CBC with Differential     Status: None   Collection Time: 04/10/14  2:25 PM  Result Value Ref Range   WBC 7.3 4.0 - 10.5 K/uL   RBC 4.67 3.87 - 5.11 MIL/uL   Hemoglobin 14.0 12.0 - 15.0 g/dL   HCT 40.1 36.0 - 46.0 %   MCV 85.9  78.0 - 100.0 fL   MCH 30.0 26.0 - 34.0 pg   MCHC 34.9 30.0 - 36.0 g/dL   RDW 11.7 11.5 - 15.5 %   Platelets 318 150 - 400 K/uL   Neutrophils Relative % 65 43 - 77 %   Neutro Abs 4.7 1.7 - 7.7 K/uL   Lymphocytes Relative 28 12 - 46 %   Lymphs Abs 2.0 0.7 - 4.0 K/uL   Monocytes Relative 6 3 - 12 %   Monocytes Absolute 0.4 0.1 - 1.0 K/uL   Eosinophils Relative 1 0 - 5 %   Eosinophils Absolute 0.1 0.0 - 0.7 K/uL   Basophils Relative 0 0 - 1 %   Basophils Absolute 0.0 0.0 - 0.1 K/uL  Comprehensive metabolic panel     Status: None   Collection Time: 04/10/14  2:25 PM  Result Value Ref Range   Sodium 137 135 - 145 mmol/L   Potassium 3.8 3.5 - 5.1 mmol/L   Chloride 103 96 - 112 mmol/L   CO2 25 19 - 32 mmol/L   Glucose, Bld 91 70 - 99 mg/dL   BUN 15 6 - 23 mg/dL   Creatinine, Ser 0.67 0.50 - 1.10 mg/dL   Calcium 9.3 8.4 - 10.5 mg/dL   Total Protein 7.6 6.0 - 8.3  g/dL   Albumin 4.7 3.5 - 5.2 g/dL   AST 16 0 - 37 U/L   ALT 13 0 - 35 U/L   Alkaline Phosphatase 50 39 - 117 U/L   Total Bilirubin 0.5 0.3 - 1.2 mg/dL   GFR calc non Af Amer >90 >90 mL/min   GFR calc Af Amer >90 >90 mL/min    Comment: (NOTE) The eGFR has been calculated using the CKD EPI equation. This calculation has not been validated in all clinical situations. eGFR's persistently <90 mL/min signify possible Chronic Kidney Disease.    Anion gap 9 5 - 15  Lipase, blood     Status: None   Collection Time: 04/10/14  2:25 PM  Result Value Ref Range   Lipase 25 11 - 59 U/L  Urinalysis, Routine w reflex microscopic     Status: Abnormal   Collection Time: 04/10/14  3:09 PM  Result Value Ref Range   Color, Urine [place] (A) YELLOW    Comment: BIOCHEMICALS MAY BE AFFECTED BY COLOR   APPearance CLOUDY (A) CLEAR   Specific Gravity, Urine 1.041 (H) 1.005 - 1.030   pH 5.0 5.0 - 8.0   Glucose, UA NEGATIVE NEGATIVE mg/dL   Hgb urine dipstick NEGATIVE NEGATIVE   Bilirubin Urine SMALL (A) NEGATIVE   Ketones, ur 15 (A) NEGATIVE  mg/dL   Protein, ur NEGATIVE NEGATIVE mg/dL   Urobilinogen, UA 1.0 0.0 - 1.0 mg/dL   Nitrite NEGATIVE NEGATIVE   Leukocytes, UA NEGATIVE NEGATIVE    Comment: MICROSCOPIC NOT DONE ON URINES WITH NEGATIVE PROTEIN, BLOOD, LEUKOCYTES, NITRITE, OR GLUCOSE <1000 mg/dL. LESS THAN 10 mL OF URINE SUBMITTED   POC Urine Pregnancy, ED  (If Pre-menopausal female) - do not order at [facility]     Status: None   Collection Time: 04/10/14  3:15 PM  Result Value Ref Range   Preg Test, Ur NEGATIVE NEGATIVE    Comment:        THE SENSITIVITY OF THIS METHODOLOGY IS >24 mIU/mL    Ct Abdomen Pelvis W Contrast  04/10/2014   CLINICAL DATA:  24 year old with ongoing epigastric abdominal pain since last Wednesday with nausea.  EXAM: CT ABDOMEN AND PELVIS WITH CONTRAST  TECHNIQUE: Multidetector CT imaging of the abdomen and pelvis was performed using the standard protocol following bolus administration of intravenous contrast.  CONTRAST:  11m OMNIPAQUE IOHEXOL 300 MG/ML SOLN, 862mOMNIPAQUE IOHEXOL 300 MG/ML SOLN  COMPARISON:  None.  FINDINGS: Lung bases are clear.  No evidence for free intraperitoneal air.  Postsurgical changes compatible with a gastric bypass procedure. There is no evidence for an obstructive bowel process. No significant bowel wall thickening.  Normal appearance of the liver, gallbladder and portal venous system. Normal appearance of the spleen, pancreas, adrenal glands and both kidneys. No gross abnormality to the uterus. Limited evaluation of the ovaries and adnexal tissue. There is a prominent follicle or involuting cyst in the right ovary measuring roughly 1.5 cm, sequence 2, image 67. Normal appearance of the appendix.  There is a small amount of free fluid in the pelvis along the anterior aspect. There are prominent central mesenteric lymph nodes. There appears to be mild mesenteric edema, best seen on sequence 2, image 41. The configuration of the mesentery is irregular and likely secondary to the  abdominal surgery. Mild swirling of the central abdominal mesentery without evidence for a bowel obstruction.  No acute bone abnormality.  IMPRESSION: Postsurgical changes from a gastric bypass procedure. There are prominent mesenteric lymph  nodes with mild mesenteric edema. There is mild swirling of the mesentery which is likely related to the surgery but no evidence for a bowel obstruction. Small amount of free fluid in the pelvis. Small amount of fluid and mild mesenteric edema could represent an inflammatory process within the abdomen.   Electronically Signed   By: Markus Daft M.D.   On: 04/10/2014 17:01    Review of Systems  Constitutional: Negative for fever, chills and weight loss.  HENT: Negative for nosebleeds.   Eyes: Negative for blurred vision.  Respiratory: Negative for shortness of breath.   Cardiovascular: Negative for chest pain, palpitations, orthopnea and PND.       Denies DOE  Gastrointestinal: Positive for abdominal pain. Negative for vomiting, diarrhea, constipation, blood in stool and melena.  Genitourinary: Negative for dysuria and hematuria.  Musculoskeletal: Negative.   Skin: Negative for itching and rash.  Neurological: Negative for dizziness, focal weakness, seizures, loss of consciousness and headaches.  Endo/Heme/Allergies: Does not bruise/bleed easily.  Psychiatric/Behavioral: The patient is not nervous/anxious.     Blood pressure 120/68, pulse 77, temperature 98.4 F (36.9 C), temperature source Oral, resp. rate 16, last menstrual period 03/19/2014, SpO2 98 %. Physical Exam  Vitals reviewed. Constitutional: She is oriented to person, place, and time. Vital signs are normal. She appears well-developed and well-nourished.  Non-toxic appearance. She does not have a sickly appearance. She does not appear ill. No distress.  HENT:  Head: Normocephalic and atraumatic.  Right Ear: External ear normal.  Left Ear: External ear normal.  Eyes: Conjunctivae are normal. No  scleral icterus.  Neck: Normal range of motion. Neck supple. No tracheal deviation present. No thyromegaly present.  Cardiovascular: Normal rate and intact distal pulses.   Respiratory: Effort normal and breath sounds normal. No stridor. No respiratory distress.  GI: Soft. She exhibits no distension. There is no tenderness. There is no rebound and no guarding.  Soft, nontender  Musculoskeletal: She exhibits no edema or tenderness.  Neurological: She is alert and oriented to person, place, and time. She exhibits normal muscle tone.  Skin: Skin is warm and dry. No rash noted. She is not diaphoretic. No erythema. No pallor.  Psychiatric: She has a normal mood and affect. Her behavior is normal. Judgment and thought content normal.     Assessment/Plan Epigastric pain S/p LRYGB  i have reviewed CT. There is a swirl sign. 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 I have reviewed her CT with several of my partners who concur. Her symptoms have been going on since at least the 15th. I have recommended a diagnostic laparoscopy, with possible repair of internal hernia, possible upper endoscopy. Also included in the diffl is a marginal ulcer which to me is less likely. She is non toxic appearing, normal vitals, normal labs, nontender, and SB doesn't look ill on CT and contrast goes thru so I think she is safe to go home and we will plan on doing surgery on Wednesday afternoon. Had extensive discussion with pt and mother and discussed pros/cons of doing surgery on Wednesday instead of tonight. We discussed what to call for &/or return to ED for. I advised her to stay on full liquid diet and soft proteins.   Leighton Ruff. Redmond Pulling, MD, FACS General, Bariatric, & Minimally Invasive Surgery [facility], [place]   [facility] M 04/10/2014, 10:48 PM

## 2014-04-10 NOTE — ED Provider Notes (Signed)
CSN: [id number]     Arrival date & time 04/10/14  1405 History   First MD Initiated Contact with Patient 04/10/14 1504     Chief Complaint  Patient presents with  . Abdominal Pain     (Consider location/radiation/quality/duration/timing/severity/associated sxs/prior Treatment) The history is provided by the patient and medical records. No language interpreter was used.     Karen Mcintyre is a 24 y.o. 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 [name]) presents to the Emergency Department complaining of gradual, persistent, waxing and waning aching epigastric abd pain onset > 1 week, worse after eating and with standing straight.  Curling into a ball makes the pain somewhat better.  She reports that last week the pain with in his RUQ and radiated to her back, but today the pain is only in the upper epigastric area.  Pt reports no N/V/D but does report constipation and decreased appetite.  Pt rates her pain at a 6/10 at its worst.  She denies NSAID usage and reports taking tylenol without relief.  Pt denies fever, chills, headache, neck pain, chest pain, SOB, weakness, dizziness, syncope, dysuria, hematuria.   Pt was evaluated on 04/02/13 with normal US abd, normal acute abd series and normal labs.  Pt was unable to follow-up with her surgeon this last week.   Past Medical History  Diagnosis Date  . Obesity   . Obesity    Past Surgical History  Procedure Laterality Date  . Wisdom teeth extraction    . Gastric roux-en-y N/A 08/01/2013    Procedure: LAPAROSCOPIC ROUX-EN-Y GASTRIC BYPASS WITH UPPER ENDOSCOPY WITH LAPAROSCOPIC REPAIR OF HIATAL HERNIA;  Surgeon: Atilano InaEric M Wilson, MD;  Location: [facility];  Service: General;  Laterality: N/A;   Family History  Problem Relation Age of Onset  . Hyperlipidemia Mother   . Alcohol abuse Father   . Cancer Paternal Grandmother     lung cancer  . Obesity Other    History  Substance Use Topics  . Smoking status: Never Smoker   . Smokeless  tobacco: Never Used  . Alcohol Use: No   OB History    No data available     Review of Systems  Constitutional: Negative for fever, diaphoresis, appetite change, fatigue and unexpected weight change.  HENT: Negative for mouth sores and trouble swallowing.   Eyes: Negative for visual disturbance.  Respiratory: Negative for cough, chest tightness, shortness of breath, wheezing and stridor.   Cardiovascular: Negative for chest pain and palpitations.  Gastrointestinal: Positive for abdominal pain and constipation. Negative for nausea, vomiting, diarrhea, blood in stool, abdominal distention and rectal pain.  Endocrine: Negative for polydipsia, polyphagia and polyuria.  Genitourinary: Negative for dysuria, urgency, frequency, hematuria, flank pain and difficulty urinating.  Musculoskeletal: Negative for back pain, neck pain and neck stiffness.  Skin: Negative for rash.  Allergic/Immunologic: Negative for immunocompromised state.  Neurological: Negative for syncope, weakness, light-headedness and headaches.  Hematological: Negative for adenopathy. Does not bruise/bleed easily.  Psychiatric/Behavioral: Negative for confusion and sleep disturbance. The patient is not nervous/anxious.   All other systems reviewed and are negative.     Allergies  Diphenhydramine hcl and Nitrofurantoin  Home Medications   Prior to Admission medications   Medication Sig Start Date End Date Taking? Authorizing Provider  Multiple Vitamin (MULTIVITAMIN WITH MINERALS) TABS tablet Take 1 tablet by mouth daily.   Yes Historical Provider, MD  Multiple Vitamins-Minerals (HAIR/SKIN/NAILS PO) Take 1 tablet by mouth daily.   Yes Historical  Provider, MD  omeprazole (PRILOSEC) 20 MG capsule Take 1 capsule (20 mg total) by mouth daily. 04/02/14  Yes Linwood Dibbles, MD  ondansetron (ZOFRAN) 4 MG tablet Take 1 tablet (4 mg total) by mouth every 6 (six) hours. 04/02/14  Yes Linwood Dibbles, MD  pantoprazole (PROTONIX) 40 MG tablet Take  1 tablet (40 mg total) by mouth daily. Patient not taking: Reported on 04/10/2014 08/03/13   Atilano Ina, MD   BP 127/74 mmHg  Pulse 63  Temp(Src) 97.9 F (36.6 C) (Oral)  Resp 16  SpO2 99%  LMP 03/19/2014 Physical Exam  Constitutional: She appears well-developed and well-nourished. No distress.  Awake, alert, nontoxic appearance  HENT:  Head: Normocephalic and atraumatic.  Mouth/Throat: Oropharynx is clear and moist. No oropharyngeal exudate.  Eyes: Conjunctivae are normal. No scleral icterus.  Neck: Normal range of motion. Neck supple.  Cardiovascular: Normal rate, regular rhythm, normal heart sounds and intact distal pulses.   No murmur heard. Pulmonary/Chest: Effort normal and breath sounds normal. No respiratory distress. She has no wheezes.  Equal chest expansion  Abdominal: Soft. Bowel sounds are normal. She exhibits no distension and no mass. There is no hepatosplenomegaly. There is tenderness in the epigastric area. There is no rebound, no guarding and no CVA tenderness.    TTP of the epigastrium No rigidity or peritoneal signs No guarding No CVA tenderness  Musculoskeletal: Normal range of motion. She exhibits no edema.  Neurological: She is alert.  Speech is clear and goal oriented Moves extremities without ataxia  Skin: Skin is warm and dry. She is not diaphoretic.  Psychiatric: She has a normal mood and affect.  Nursing note and vitals reviewed.   ED Course  Procedures (including critical care time) Labs Review Labs Reviewed  URINALYSIS, ROUTINE W REFLEX MICROSCOPIC - Abnormal; Notable for the following:    Color, Urine Gesselle (*)    APPearance CLOUDY (*)    Specific Gravity, Urine 1.041 (*)    Bilirubin Urine SMALL (*)    Ketones, ur 15 (*)    All other components within normal limits  CBC WITH DIFFERENTIAL/PLATELET  COMPREHENSIVE METABOLIC PANEL  LIPASE, BLOOD  POC URINE PREG, ED    Imaging Review Ct Abdomen Pelvis W Contrast  04/10/2014    CLINICAL DATA:  24 year old with ongoing epigastric abdominal pain since last Wednesday with nausea.  EXAM: CT ABDOMEN AND PELVIS WITH CONTRAST  TECHNIQUE: Multidetector CT imaging of the abdomen and pelvis was performed using the standard protocol following bolus administration of intravenous contrast.  CONTRAST:  25mL OMNIPAQUE IOHEXOL 300 MG/ML SOLN, 80mL OMNIPAQUE IOHEXOL 300 MG/ML SOLN  COMPARISON:  None.  FINDINGS: Lung bases are clear.  No evidence for free intraperitoneal air.  Postsurgical changes compatible with a gastric bypass procedure. There is no evidence for an obstructive bowel process. No significant bowel wall thickening.  Normal appearance of the liver, gallbladder and portal venous system. Normal appearance of the spleen, pancreas, adrenal glands and both kidneys. No gross abnormality to the uterus. Limited evaluation of the ovaries and adnexal tissue. There is a prominent follicle or involuting cyst in the right ovary measuring roughly 1.5 cm, sequence 2, image 67. Normal appearance of the appendix.  There is a small amount of free fluid in the pelvis along the anterior aspect. There are prominent central mesenteric lymph nodes. There appears to be mild mesenteric edema, best seen on sequence 2, image 41. The configuration of the mesentery is irregular and likely secondary to the abdominal  surgery. Mild swirling of the central abdominal mesentery without evidence for a bowel obstruction.  No acute bone abnormality.  IMPRESSION: Postsurgical changes from a gastric bypass procedure. There are prominent mesenteric lymph nodes with mild mesenteric edema. There is mild swirling of the mesentery which is likely related to the surgery but no evidence for a bowel obstruction. Small amount of free fluid in the pelvis. Small amount of fluid and mild mesenteric edema could represent an inflammatory process within the abdomen.   Electronically Signed   By: Richarda Overlie M.D.   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  This is pt's second visit.  Dr. Andrey Campanile knows that she is here and we will consult.  Labs reassuring at this time.  CMP and Lipase pending.    3:21 PM PT evaluated by Dr. Andrey Campanile.  Will obtain CT abd pelvis to r/o internal hernia.  If CT negative, will treat like ulcer with Carafate and protonix.  5:50 PM CT without radiology read of internal hernia, however Dr. Andrey Campanile and several of his partners have reviewed the images and believe that she may indeed have an internal hernia.  Pt will be posted for the OR schedule on Wednesday with Dr. Andrey Campanile.  She is safe for d/c home but she is to return immediately to the ER for worsening pain or vomiting.  Pt is to be on a liquid and soft protein diet until her surgery.  Pt is without sepsis, hemodynamically stable and there is no evidence of ischemic bowel.    I have personally reviewed patient's vitals, nursing note and any pertinent labs or imaging.  I performed an undressed physical exam.    It has been determined that no acute conditions requiring further emergency intervention are present at this time. The patient/guardian have been advised of the diagnosis and plan. I reviewed all labs and imaging including any potential incidental findings. We have discussed signs and symptoms that warrant return to the ED and they are listed in the discharge instructions.    Vital signs are stable at discharge.   BP 127/74 mmHg  Pulse 63  Temp(Src) 97.9 F (36.6 C) (Oral)  Resp 16  SpO2 99%  LMP 03/19/2014         Dierdre Forth, PA-C 04/10/14 1758  Ethelda Chick, MD 04/10/14 [phone]

## 2014-04-10 NOTE — ED Notes (Signed)
Pt to CT

## 2014-04-10 NOTE — ED Notes (Signed)
Pt escorted to discharge window. Pt verbalized understanding discharge instructions. In no acute distress.  

## 2014-04-10 NOTE — ED Notes (Signed)
Pt c/o abd pain for over a week.  Pt denies n/v/d at this time. Pt was seen last week and all studies were good. Pt states that her doctor told her to come to ED since she is still having pains.

## 2014-04-10 NOTE — Discharge Instructions (Signed)
1. Medications: Tylenol and home nausea medications, usual home medications 2. Treatment: rest, drink plenty of fluids,  3. Follow Up: Please followup with Dr. [name] as directed for your planned surgery on Wednesday; Please return to the ER for worsening pain, fevers or vomiting

## 2014-04-10 NOTE — ED Provider Notes (Signed)
Dr [name] called to notify us that he is sending the patient to the ED.  She was seen on the 17th in the ED.  Had labs,  negative plain films and US of abdomen.  She was referred back to her surgeon.  Was not seen in the office and called to be seen today.  Dr [name] is not in the office.  Pt referred back to the ED.  Please call him to discuss case.  He can see the patient.  He is Available until 5pm.  Linwood DibblesJon Gala Padovano, MD 04/10/14 1430

## 2014-04-11 ENCOUNTER — Encounter (HOSPITAL_COMMUNITY): Payer: Self-pay | Admitting: *Deleted

## 2014-04-12 ENCOUNTER — Encounter (HOSPITAL_COMMUNITY)
Admission: RE | Disposition: A | Discharge status: Home / Self Care | Payer: Self-pay | Source: Ambulatory Visit | Attending: General Surgery

## 2014-04-12 ENCOUNTER — Ambulatory Visit (HOSPITAL_COMMUNITY)
Admission: RE | Admit: 2014-04-12 | Discharge: 2014-04-13 | Disposition: A | Discharge status: Home / Self Care | Payer: BLUE CROSS/BLUE SHIELD | Source: Ambulatory Visit | Attending: General Surgery | Admitting: General Surgery

## 2014-04-12 ENCOUNTER — Ambulatory Visit (HOSPITAL_COMMUNITY): Payer: BLUE CROSS/BLUE SHIELD | Admitting: Anesthesiology

## 2014-04-12 ENCOUNTER — Encounter (HOSPITAL_COMMUNITY): Payer: Self-pay | Admitting: *Deleted

## 2014-04-12 DIAGNOSIS — K660 Peritoneal adhesions (postprocedural) (postinfection): Secondary | ICD-10-CM | POA: Insufficient documentation

## 2014-04-12 DIAGNOSIS — K458 Other specified abdominal hernia without obstruction or gangrene: Secondary | ICD-10-CM

## 2014-04-12 DIAGNOSIS — Z9884 Bariatric surgery status: Secondary | ICD-10-CM | POA: Diagnosis not present

## 2014-04-12 DIAGNOSIS — R1013 Epigastric pain: Secondary | ICD-10-CM | POA: Diagnosis present

## 2014-04-12 HISTORY — PX: LAPAROSCOPY: SHX197

## 2014-04-12 HISTORY — PX: LAPAROSCOPIC LYSIS OF ADHESIONS: SHX5905

## 2014-04-12 HISTORY — PX: LAPAROSCOPIC INTERNAL HERNIA REPAIR: SHX6502

## 2014-04-12 HISTORY — DX: Epigastric pain: R10.13

## 2014-04-12 SURGERY — LAPAROSCOPIC INTERNAL HERNIA REPAIR
Anesthesia: General | Site: Abdomen

## 2014-04-12 MED ORDER — ONDANSETRON HCL 4 MG/2ML IJ SOLN
INTRAMUSCULAR | Status: DC | PRN
Start: 2014-04-12 — End: 2014-04-12
  Administered 2014-04-12: 4 mg via INTRAVENOUS

## 2014-04-12 MED ORDER — CISATRACURIUM BESYLATE (PF) 10 MG/5ML IV SOLN
INTRAVENOUS | Status: DC | PRN
  Administered 2014-04-12 (×2): 4 mg via INTRAVENOUS
  Administered 2014-04-12: 6 mg via INTRAVENOUS

## 2014-04-12 MED ORDER — LACTATED RINGERS IV SOLN
INTRAVENOUS | Status: DC
  Administered 2014-04-12: 1000 mL via INTRAVENOUS

## 2014-04-12 MED ORDER — MORPHINE SULFATE 2 MG/ML IJ SOLN
1.0000 mg | INTRAMUSCULAR | Status: DC | PRN
  Administered 2014-04-12 – 2014-04-13 (×3): 2 mg via INTRAVENOUS
  Filled 2014-04-12 (×3): qty 1

## 2014-04-12 MED ORDER — BUPIVACAINE-EPINEPHRINE 0.25% -1:200000 IJ SOLN
INTRAMUSCULAR | Status: DC | PRN
  Administered 2014-04-12 (×2): 50 mL

## 2014-04-12 MED ORDER — LACTATED RINGERS IV SOLN
INTRAVENOUS | Status: DC
Start: 2014-04-12 — End: 2014-04-12

## 2014-04-12 MED ORDER — GLYCOPYRROLATE 0.2 MG/ML IJ SOLN
INTRAMUSCULAR | Status: DC | PRN
  Administered 2014-04-12: 0.6 mg via INTRAVENOUS

## 2014-04-12 MED ORDER — LACTATED RINGERS IV SOLN: INTRAVENOUS | Status: DC

## 2014-04-12 MED ORDER — PROPOFOL 10 MG/ML IV BOLUS
INTRAVENOUS | Status: AC
  Filled 2014-04-12: qty 20

## 2014-04-12 MED ORDER — HYDROMORPHONE HCL 1 MG/ML IJ SOLN
INTRAMUSCULAR | Status: AC
  Filled 2014-04-12: qty 1

## 2014-04-12 MED ORDER — LACTATED RINGERS IV SOLN
INTRAVENOUS | Status: DC | PRN
  Administered 2014-04-12: 1000 mL via INTRAVENOUS

## 2014-04-12 MED ORDER — SUCCINYLCHOLINE CHLORIDE 20 MG/ML IJ SOLN
INTRAMUSCULAR | Status: DC | PRN
  Administered 2014-04-12: 100 mg via INTRAVENOUS

## 2014-04-12 MED ORDER — CEFOTETAN DISODIUM-DEXTROSE 2-2.08 GM-% IV SOLR
2.0000 g | INTRAVENOUS | Status: AC
  Administered 2014-04-12: 2 g via INTRAVENOUS

## 2014-04-12 MED ORDER — KCL IN DEXTROSE-NACL 20-5-0.45 MEQ/L-%-% IV SOLN
INTRAVENOUS | Status: DC
  Administered 2014-04-12: 125 mL/h via INTRAVENOUS
  Administered 2014-04-12 – 2014-04-13 (×2): via INTRAVENOUS
  Filled 2014-04-12 (×4): qty 1000

## 2014-04-12 MED ORDER — BUPIVACAINE LIPOSOME 1.3 % IJ SUSP
20.0000 mL | Freq: Once | INTRAMUSCULAR | Status: DC
  Filled 2014-04-12: qty 20

## 2014-04-12 MED ORDER — MIDAZOLAM HCL 2 MG/2ML IJ SOLN
INTRAMUSCULAR | Status: AC
  Filled 2014-04-12: qty 2

## 2014-04-12 MED ORDER — HEPARIN SODIUM (PORCINE) 5000 UNIT/ML IJ SOLN
5000.0000 [IU] | INTRAMUSCULAR | Status: AC
  Administered 2014-04-12: 5000 [IU] via SUBCUTANEOUS
  Filled 2014-04-12: qty 1

## 2014-04-12 MED ORDER — LACTATED RINGERS IV SOLN
INTRAVENOUS | Status: DC | PRN
  Administered 2014-04-12 (×3): via INTRAVENOUS

## 2014-04-12 MED ORDER — SUFENTANIL CITRATE 50 MCG/ML IV SOLN
INTRAVENOUS | Status: DC | PRN
  Administered 2014-04-12 (×8): 5 ug via INTRAVENOUS
  Administered 2014-04-12: 10 ug via INTRAVENOUS

## 2014-04-12 MED ORDER — ONDANSETRON HCL 4 MG/2ML IJ SOLN
4.0000 mg | Freq: Four times a day (QID) | INTRAMUSCULAR | Status: DC | PRN
  Administered 2014-04-12: 4 mg via INTRAVENOUS
  Filled 2014-04-12: qty 2

## 2014-04-12 MED ORDER — BUPIVACAINE-EPINEPHRINE 0.25% -1:200000 IJ SOLN
INTRAMUSCULAR | Status: AC
  Filled 2014-04-12: qty 1

## 2014-04-12 MED ORDER — CEFOTETAN DISODIUM-DEXTROSE 2-2.08 GM-% IV SOLR
INTRAVENOUS | Status: AC
  Filled 2014-04-12: qty 50

## 2014-04-12 MED ORDER — CISATRACURIUM BESYLATE 20 MG/10ML IV SOLN
INTRAVENOUS | Status: AC
  Filled 2014-04-12: qty 10

## 2014-04-12 MED ORDER — ENOXAPARIN SODIUM 40 MG/0.4ML ~~LOC~~ SOLN
40.0000 mg | SUBCUTANEOUS | Status: DC
  Administered 2014-04-13: 40 mg via SUBCUTANEOUS
  Filled 2014-04-12 (×2): qty 0.4

## 2014-04-12 MED ORDER — ONDANSETRON HCL 4 MG/2ML IJ SOLN
INTRAMUSCULAR | Status: AC
  Filled 2014-04-12: qty 2

## 2014-04-12 MED ORDER — OXYCODONE-ACETAMINOPHEN 5-325 MG PO TABS
1.0000 | ORAL_TABLET | ORAL | Status: DC | PRN
  Administered 2014-04-13: 1 via ORAL
  Administered 2014-04-13: 2 via ORAL
  Filled 2014-04-12: qty 2
  Filled 2014-04-12: qty 1

## 2014-04-12 MED ORDER — METOCLOPRAMIDE HCL 5 MG/ML IJ SOLN
INTRAMUSCULAR | Status: AC
  Filled 2014-04-12: qty 2

## 2014-04-12 MED ORDER — CHLORHEXIDINE GLUCONATE 4 % EX LIQD: 60.0000 mL | Freq: Once | CUTANEOUS | Status: DC

## 2014-04-12 MED ORDER — ONDANSETRON HCL 4 MG PO TABS: 4.0000 mg | ORAL_TABLET | Freq: Four times a day (QID) | ORAL | Status: DC | PRN

## 2014-04-12 MED ORDER — DEXAMETHASONE SODIUM PHOSPHATE 10 MG/ML IJ SOLN
INTRAMUSCULAR | Status: AC
  Filled 2014-04-12: qty 1

## 2014-04-12 MED ORDER — METOCLOPRAMIDE HCL 5 MG/ML IJ SOLN
INTRAMUSCULAR | Status: DC | PRN
  Administered 2014-04-12: 10 mg via INTRAVENOUS

## 2014-04-12 MED ORDER — HYDROMORPHONE HCL 1 MG/ML IJ SOLN
0.2500 mg | INTRAMUSCULAR | Status: DC | PRN
  Administered 2014-04-12: 0.25 mg via INTRAVENOUS

## 2014-04-12 MED ORDER — MIDAZOLAM HCL 5 MG/5ML IJ SOLN
INTRAMUSCULAR | Status: DC | PRN
  Administered 2014-04-12: 2 mg via INTRAVENOUS

## 2014-04-12 MED ORDER — PROPOFOL 10 MG/ML IV BOLUS
INTRAVENOUS | Status: DC | PRN
  Administered 2014-04-12: 130 mg via INTRAVENOUS

## 2014-04-12 MED ORDER — SUFENTANIL CITRATE 50 MCG/ML IV SOLN
INTRAVENOUS | Status: AC
  Filled 2014-04-12: qty 1

## 2014-04-12 MED ORDER — DEXAMETHASONE SODIUM PHOSPHATE 10 MG/ML IJ SOLN
INTRAMUSCULAR | Status: DC | PRN
  Administered 2014-04-12: 10 mg via INTRAVENOUS

## 2014-04-12 MED ORDER — PANTOPRAZOLE SODIUM 40 MG IV SOLR
40.0000 mg | INTRAVENOUS | Status: DC
  Administered 2014-04-12: 40 mg via INTRAVENOUS
  Filled 2014-04-12 (×2): qty 40

## 2014-04-12 MED ORDER — PROMETHAZINE HCL 25 MG/ML IJ SOLN: 12.5000 mg | Freq: Four times a day (QID) | INTRAMUSCULAR | Status: DC | PRN

## 2014-04-12 MED ORDER — NEOSTIGMINE METHYLSULFATE 10 MG/10ML IV SOLN
INTRAVENOUS | Status: DC | PRN
  Administered 2014-04-12: 4 mg via INTRAVENOUS

## 2014-04-12 MED ORDER — 0.9 % SODIUM CHLORIDE (POUR BTL) OPTIME
TOPICAL | Status: DC | PRN
  Administered 2014-04-12: 1000 mL

## 2014-04-12 MED ORDER — SODIUM CHLORIDE 0.9 % IJ SOLN
INTRAMUSCULAR | Status: AC
  Filled 2014-04-12: qty 10

## 2014-04-12 SURGICAL SUPPLY — 81 items
APPLIER CLIP 5 13 M/L LIGAMAX5 (MISCELLANEOUS)
APPLIER CLIP ROT 10 11.4 M/L (STAPLE)
APR CLP MED LRG 11.4X10 (STAPLE)
APR CLP MED LRG 5 ANG JAW (MISCELLANEOUS)
BLADE EXTENDED COATED 6.5IN (ELECTRODE) IMPLANT
BLADE HEX COATED 2.75 (ELECTRODE) IMPLANT
BLADE SURG SZ10 CARB STEEL (BLADE) IMPLANT
CLIP APPLIE 5 13 M/L LIGAMAX5 (MISCELLANEOUS) IMPLANT
CLIP APPLIE ROT 10 11.4 M/L (STAPLE) IMPLANT
CLIP SUT LAPRA TY ABSORB (SUTURE) IMPLANT
CLOSURE WOUND 1/2 X4 (GAUZE/BANDAGES/DRESSINGS)
COVER MAYO STAND STRL (DRAPES) ×2 IMPLANT
DECANTER SPIKE VIAL GLASS SM (MISCELLANEOUS) ×8 IMPLANT
DEVICE SUTURE ENDOST 10MM (ENDOMECHANICALS) IMPLANT
DEVICE TROCAR PUNCTURE CLOSURE (ENDOMECHANICALS) ×2 IMPLANT
DISSECTOR BLUNT TIP ENDO 5MM (MISCELLANEOUS) IMPLANT
DRAPE LAPAROSCOPIC ABDOMINAL (DRAPES) ×5 IMPLANT
DRAPE UTILITY XL STRL (DRAPES) ×5 IMPLANT
DRAPE WARM FLUID 44X44 (DRAPE) IMPLANT
ELECT CAUTERY BLADE 6.4 (BLADE) ×2 IMPLANT
ELECT REM PT RETURN 9FT ADLT (ELECTROSURGICAL) ×5
ELECTRODE REM PT RTRN 9FT ADLT (ELECTROSURGICAL) ×3 IMPLANT
FILTER SMOKE EVAC LAPAROSHD (FILTER) IMPLANT
GAUZE SPONGE 4X4 12PLY STRL (GAUZE/BANDAGES/DRESSINGS) ×3 IMPLANT
GLOVE BIO SURGEON STRL SZ7 (GLOVE) ×5 IMPLANT
GLOVE BIO SURGEON STRL SZ7.5 (GLOVE) ×5 IMPLANT
GLOVE BIOGEL M 7.0 STRL (GLOVE) ×5 IMPLANT
GLOVE BIOGEL M STRL SZ7.5 (GLOVE) ×5 IMPLANT
GLOVE BIOGEL PI IND STRL 7.0 (GLOVE) ×3 IMPLANT
GLOVE BIOGEL PI INDICATOR 7.0 (GLOVE) ×2
GLOVE INDICATOR 8.0 STRL GRN (GLOVE) ×5 IMPLANT
GOWN STRL REUS W/ TWL XL LVL3 (GOWN DISPOSABLE) ×3 IMPLANT
GOWN STRL REUS W/TWL LRG LVL3 (GOWN DISPOSABLE) ×14 IMPLANT
GOWN STRL REUS W/TWL XL LVL3 (GOWN DISPOSABLE) ×9 IMPLANT
KIT BASIN OR (CUSTOM PROCEDURE TRAY) ×5 IMPLANT
LIGASURE IMPACT 36 18CM CVD LR (INSTRUMENTS) IMPLANT
LIQUID BAND (GAUZE/BANDAGES/DRESSINGS) ×2 IMPLANT
NDL SPNL 22GX3.5 QUINCKE BK (NEEDLE) IMPLANT
NEEDLE SPNL 22GX3.5 QUINCKE BK (NEEDLE) IMPLANT
NS IRRIG 1000ML POUR BTL (IV SOLUTION) ×5 IMPLANT
PENCIL BUTTON HOLSTER BLD 10FT (ELECTRODE) ×4 IMPLANT
RELOAD ENDO STITCH 2.0 (ENDOMECHANICALS)
RELOAD SUT SNGL STCH BLK 2-0 (ENDOMECHANICALS) IMPLANT
SET IRRIG TUBING LAPAROSCOPIC (IRRIGATION / IRRIGATOR) ×2 IMPLANT
SHEARS HARMONIC ACE PLUS 36CM (ENDOMECHANICALS) IMPLANT
SLEEVE XCEL OPT CAN 5 100 (ENDOMECHANICALS) ×2 IMPLANT
SOLUTION ANTI FOG 6CC (MISCELLANEOUS) ×3 IMPLANT
SPONGE LAP 18X18 X RAY DECT (DISPOSABLE) IMPLANT
STAPLER VISISTAT 35W (STAPLE) ×5 IMPLANT
STRIP CLOSURE SKIN 1/2X4 (GAUZE/BANDAGES/DRESSINGS) IMPLANT
SUCTION POOLE TIP (SUCTIONS) IMPLANT
SUT MNCRL AB 4-0 PS2 18 (SUTURE) ×5 IMPLANT
SUT PDS AB 1 TP1 96 (SUTURE) IMPLANT
SUT PROLENE 2 0 KS (SUTURE) IMPLANT
SUT PROLENE 2 0 SH DA (SUTURE) IMPLANT
SUT RELOAD ENDO STITCH 2.0 (ENDOMECHANICALS)
SUT SILK 2 0 (SUTURE)
SUT SILK 2 0 SH (SUTURE) IMPLANT
SUT SILK 2 0 SH CR/8 (SUTURE) IMPLANT
SUT SILK 2-0 18XBRD TIE 12 (SUTURE) IMPLANT
SUT SILK 3 0 (SUTURE)
SUT SILK 3 0 SH CR/8 (SUTURE) IMPLANT
SUT SILK 3-0 18XBRD TIE 12 (SUTURE) IMPLANT
SUT VICRYL 0 UR6 27IN ABS (SUTURE) ×4 IMPLANT
SUTURE RELOAD ENDO STITCH 2.0 (ENDOMECHANICALS) IMPLANT
SYS LAPSCP GELPORT 120MM (MISCELLANEOUS)
SYSTEM LAPSCP GELPORT 120MM (MISCELLANEOUS) IMPLANT
TOWEL OR 17X26 10 PK STRL BLUE (TOWEL DISPOSABLE) ×5 IMPLANT
TOWEL OR NON WOVEN STRL DISP B (DISPOSABLE) ×5 IMPLANT
TRAY FOLEY CATH 14FRSI W/METER (CATHETERS) ×5 IMPLANT
TRAY LAPAROSCOPIC (CUSTOM PROCEDURE TRAY) ×5 IMPLANT
TROCAR ADV FIXATION 5X100MM (TROCAR) ×4 IMPLANT
TROCAR BLADELESS OPT 5 100 (ENDOMECHANICALS) ×5 IMPLANT
TROCAR BLADELESS OPT 5 75 (ENDOMECHANICALS) ×3 IMPLANT
TROCAR XCEL 12X100 BLDLESS (ENDOMECHANICALS) IMPLANT
TROCAR XCEL BLUNT TIP 100MML (ENDOMECHANICALS) IMPLANT
TROCAR XCEL NON-BLD 11X100MML (ENDOMECHANICALS) IMPLANT
TROCAR XCEL UNIV SLVE 11M 100M (ENDOMECHANICALS) IMPLANT
TUBING INSUFFLATION 10FT LAP (TUBING) ×2 IMPLANT
YANKAUER SUCT BULB TIP 10FT TU (MISCELLANEOUS) IMPLANT
YANKAUER SUCT BULB TIP NO VENT (SUCTIONS) IMPLANT

## 2014-04-12 NOTE — Brief Op Note (Signed)
04/12/2014  3:51 PM  PATIENT:  Joice LoftsAmber M Dunn  24 y.o. female  PRE-OPERATIVE DIAGNOSIS:  epigastric pain, h/o lap roux en y gastric bypass  POST-OPERATIVE DIAGNOSIS:  ADHESIONS, INTERNAL HERNIA  PROCEDURE:  Procedure(s): REDUCTION OF INTERNAL HERNIA REPAIR (N/A) LAPAROSCOPY DIAGNOSTIC (N/A) LAPAROSCOPIC LYSIS OF ADHESIONS  SURGEON:  Surgeon(s) and Role:    * Atilano InaEric M Therma Lasure, MD - Primary  PHYSICIAN ASSISTANT:   ASSISTANTS: Wenda LowMatt Martin, MD   ANESTHESIA:   general  EBL:  Total I/O In: 2300 [I.V.:2300] Out: 245 [Urine:235; Blood:10]  BLOOD ADMINISTERED:none  DRAINS: none   LOCAL MEDICATIONS USED:  MARCAINE     SPECIMEN:  No Specimen  DISPOSITION OF SPECIMEN:  N/A  COUNTS:  YES  TOURNIQUET:  * No tourniquets in log *  DICTATION: .Other Dictation: Dictation Number [id number]  PLAN OF CARE: Admit for overnight observation  PATIENT DISPOSITION:  PACU - hemodynamically stable.   Delay start of Pharmacological VTE agent (>24hrs) due to surgical blood loss or risk of bleeding: no Mary SellaEric M. [name], MD, FACS General, Bariatric, & Minimally Invasive Surgery [facility], [name]

## 2014-04-12 NOTE — Interval H&P Note (Signed)
History and Physical Interval Note:  04/12/2014 12:28 PM  Karen Mcintyre  has presented today for surgery, with the diagnosis of epigastric pain, possible internal hernia  The various methods of treatment have been discussed with the patient and family. After consideration of risks, benefits and other options for treatment, the patient has consented to  Procedure(s): POSSIBLE  INTERNAL HERNIA REPAIR (N/A) UPPER GI ENDOSCOPY (N/A) LAPAROSCOPY DIAGNOSTIC (N/A) as a surgical intervention .  The patient's history has been reviewed, patient examined, no change in status, stable for surgery.  I have reviewed the patient's chart and labs.  Questions were answered to the patient's satisfaction.    Mary SellaEric M. [name], MD, FACS General, Bariatric, & Minimally Invasive Surgery [facility], [name]  [facility] CenterWILSON,Orhan Mayorga M

## 2014-04-12 NOTE — Transfer of Care (Signed)
Immediate Anesthesia Transfer of Care Note  Patient: Karen Mcintyre  Procedure(s) Performed: Procedure(s): REDUCTION OF INTERNAL HERNIA REPAIR (N/A) LAPAROSCOPY DIAGNOSTIC (N/A) LAPAROSCOPIC LYSIS OF ADHESIONS  Patient Location: PACU  Anesthesia Type:General  Level of Consciousness: awake, oriented and patient cooperative  Airway & Oxygen Therapy: Patient Spontanous Breathing and Patient connected to face mask oxygen  Post-op Assessment: Report given to PACU RN, Post -op Vital signs reviewed and stable and Patient moving all extremities  Post vital signs: Reviewed and stable  Complications: No apparent anesthesia complications

## 2014-04-12 NOTE — Anesthesia Preprocedure Evaluation (Signed)
Anesthesia Evaluation  Patient identified by MRN, date of birth, ID band Patient awake    Reviewed: Allergy & Precautions, H&P , NPO status , Patient's Chart, lab work & pertinent test results  Airway Mallampati: II  TM Distance: >3 FB Neck ROM: full    Dental no notable dental hx. (+) Teeth Intact, Dental Advisory Given   Pulmonary neg pulmonary ROS,  breath sounds clear to auscultation  Pulmonary exam normal       Cardiovascular Exercise Tolerance: Good negative cardio ROS  Rhythm:regular Rate:Normal     Neuro/Psych negative neurological ROS  negative psych ROS   GI/Hepatic negative GI ROS, Neg liver ROS, GERD-  Medicated and Controlled,  Endo/Other  negative endocrine ROS  Renal/GU negative Renal ROS  negative genitourinary   Musculoskeletal   Abdominal   Peds  Hematology negative hematology ROS (+)   Anesthesia Other Findings   Reproductive/Obstetrics negative OB ROS                             Anesthesia Physical Anesthesia Plan  ASA: II  Anesthesia Plan: General   Post-op Pain Management:    Induction: Intravenous  Airway Management Planned: Oral ETT  Additional Equipment:   Intra-op Plan:   Post-operative Plan: Extubation in OR  Informed Consent: I have reviewed the patients History and Physical, chart, labs and discussed the procedure including the risks, benefits and alternatives for the proposed anesthesia with the patient or authorized representative who has indicated his/her understanding and acceptance.   Dental Advisory Given  Plan Discussed with: CRNA and Surgeon  Anesthesia Plan Comments:         Anesthesia Quick Evaluation

## 2014-04-12 NOTE — Anesthesia Procedure Notes (Signed)
Procedure Name: Intubation Date/Time: 04/12/2014 12:37 PM Performed by: Paulla DollyJOYCE, Ranita Stjulien A Pre-anesthesia Checklist: Patient identified, Timeout performed, Emergency Drugs available, Suction available and Patient being monitored Patient Re-evaluated:Patient Re-evaluated prior to inductionOxygen Delivery Method: Circle system utilized Preoxygenation: Pre-oxygenation with 100% oxygen Intubation Type: Combination inhalational/ intravenous induction Ventilation: Mask ventilation without difficulty Tube type: Oral Tube size: 7.0 mm Number of attempts: 1 Airway Equipment and Method: Stylet Placement Confirmation: ETT inserted through vocal cords under direct vision,  breath sounds checked- equal and bilateral and positive ETCO2 Secured at: 20 cm Tube secured with: Tape Dental Injury: Teeth and Oropharynx as per pre-operative assessment

## 2014-04-12 NOTE — H&P (View-Only) (Signed)
Karen Mcintyre is an 24 y.o. female.   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 It was constant and persistent which prompted her to go to the ED on 1/17 where an abd u/s and plain films were done along with labs which were normal and she was sent home. The soreness persisted, it was constant, it would occasionally worsen with PO. We were able to contact her today and advised her to come to the ED to get checked out. No NSAIDS. Sometimes standing straight up worsens it, curling up makes it better. Mild nausea. No emesis. +flatus and BM. No fever/chills/melena/hematochezia.   Past Medical History  Diagnosis Date  . Obesity   . Obesity     Past Surgical History  Procedure Laterality Date  . Wisdom teeth extraction    . Gastric roux-en-y N/A 08/01/2013    Procedure: LAPAROSCOPIC ROUX-EN-Y GASTRIC BYPASS WITH UPPER ENDOSCOPY WITH LAPAROSCOPIC REPAIR OF HIATAL HERNIA;  Surgeon: Gayland Curry, MD;  Location: [facility];  Service: General;  Laterality: N/A;    Family History  Problem Relation Age of Onset  . Hyperlipidemia Mother   . Alcohol abuse Father   . Cancer Paternal Grandmother     lung cancer  . Obesity Other    Social History:  reports that she has never smoked. She has never used smokeless tobacco. She reports that she does not drink alcohol or use illicit drugs.  Allergies:  Allergies  Allergen Reactions  . Diphenhydramine Hcl     REACTION: urticaria (hives)  . Nitrofurantoin     REACTION: unspecified     (Not in a hospital admission)  Results for orders placed or performed during the hospital encounter of 04/10/14 (from the past 48 hour(s))  CBC with Differential     Status: None   Collection Time: 04/10/14  2:25 PM  Result Value Ref Range   WBC 7.3 4.0 - 10.5 K/uL   RBC 4.67 3.87 - 5.11 MIL/uL   Hemoglobin 14.0 12.0 - 15.0 g/dL   HCT 40.1 36.0 - 46.0 %   MCV 85.9  78.0 - 100.0 fL   MCH 30.0 26.0 - 34.0 pg   MCHC 34.9 30.0 - 36.0 g/dL   RDW 11.7 11.5 - 15.5 %   Platelets 318 150 - 400 K/uL   Neutrophils Relative % 65 43 - 77 %   Neutro Abs 4.7 1.7 - 7.7 K/uL   Lymphocytes Relative 28 12 - 46 %   Lymphs Abs 2.0 0.7 - 4.0 K/uL   Monocytes Relative 6 3 - 12 %   Monocytes Absolute 0.4 0.1 - 1.0 K/uL   Eosinophils Relative 1 0 - 5 %   Eosinophils Absolute 0.1 0.0 - 0.7 K/uL   Basophils Relative 0 0 - 1 %   Basophils Absolute 0.0 0.0 - 0.1 K/uL  Comprehensive metabolic panel     Status: None   Collection Time: 04/10/14  2:25 PM  Result Value Ref Range   Sodium 137 135 - 145 mmol/L   Potassium 3.8 3.5 - 5.1 mmol/L   Chloride 103 96 - 112 mmol/L   CO2 25 19 - 32 mmol/L   Glucose, Bld 91 70 - 99 mg/dL   BUN 15 6 - 23 mg/dL   Creatinine, Ser 0.67 0.50 - 1.10 mg/dL   Calcium 9.3 8.4 - 10.5 mg/dL   Total Protein 7.6 6.0 - 8.3  g/dL   Albumin 4.7 3.5 - 5.2 g/dL   AST 16 0 - 37 U/L   ALT 13 0 - 35 U/L   Alkaline Phosphatase 50 39 - 117 U/L   Total Bilirubin 0.5 0.3 - 1.2 mg/dL   GFR calc non Af Amer >90 >90 mL/min   GFR calc Af Amer >90 >90 mL/min    Comment: (NOTE) The eGFR has been calculated using the CKD EPI equation. This calculation has not been validated in all clinical situations. eGFR's persistently <90 mL/min signify possible Chronic Kidney Disease.    Anion gap 9 5 - 15  Lipase, blood     Status: None   Collection Time: 04/10/14  2:25 PM  Result Value Ref Range   Lipase 25 11 - 59 U/L  Urinalysis, Routine w reflex microscopic     Status: Abnormal   Collection Time: 04/10/14  3:09 PM  Result Value Ref Range   Color, Urine [place] (A) YELLOW    Comment: BIOCHEMICALS MAY BE AFFECTED BY COLOR   APPearance CLOUDY (A) CLEAR   Specific Gravity, Urine 1.041 (H) 1.005 - 1.030   pH 5.0 5.0 - 8.0   Glucose, UA NEGATIVE NEGATIVE mg/dL   Hgb urine dipstick NEGATIVE NEGATIVE   Bilirubin Urine SMALL (A) NEGATIVE   Ketones, ur 15 (A) NEGATIVE  mg/dL   Protein, ur NEGATIVE NEGATIVE mg/dL   Urobilinogen, UA 1.0 0.0 - 1.0 mg/dL   Nitrite NEGATIVE NEGATIVE   Leukocytes, UA NEGATIVE NEGATIVE    Comment: MICROSCOPIC NOT DONE ON URINES WITH NEGATIVE PROTEIN, BLOOD, LEUKOCYTES, NITRITE, OR GLUCOSE <1000 mg/dL. LESS THAN 10 mL OF URINE SUBMITTED   POC Urine Pregnancy, ED  (If Pre-menopausal female) - do not order at [facility]     Status: None   Collection Time: 04/10/14  3:15 PM  Result Value Ref Range   Preg Test, Ur NEGATIVE NEGATIVE    Comment:        THE SENSITIVITY OF THIS METHODOLOGY IS >24 mIU/mL    Ct Abdomen Pelvis W Contrast  04/10/2014   CLINICAL DATA:  24 year old with ongoing epigastric abdominal pain since last Wednesday with nausea.  EXAM: CT ABDOMEN AND PELVIS WITH CONTRAST  TECHNIQUE: Multidetector CT imaging of the abdomen and pelvis was performed using the standard protocol following bolus administration of intravenous contrast.  CONTRAST:  11m OMNIPAQUE IOHEXOL 300 MG/ML SOLN, 862mOMNIPAQUE IOHEXOL 300 MG/ML SOLN  COMPARISON:  None.  FINDINGS: Lung bases are clear.  No evidence for free intraperitoneal air.  Postsurgical changes compatible with a gastric bypass procedure. There is no evidence for an obstructive bowel process. No significant bowel wall thickening.  Normal appearance of the liver, gallbladder and portal venous system. Normal appearance of the spleen, pancreas, adrenal glands and both kidneys. No gross abnormality to the uterus. Limited evaluation of the ovaries and adnexal tissue. There is a prominent follicle or involuting cyst in the right ovary measuring roughly 1.5 cm, sequence 2, image 67. Normal appearance of the appendix.  There is a small amount of free fluid in the pelvis along the anterior aspect. There are prominent central mesenteric lymph nodes. There appears to be mild mesenteric edema, best seen on sequence 2, image 41. The configuration of the mesentery is irregular and likely secondary to the  abdominal surgery. Mild swirling of the central abdominal mesentery without evidence for a bowel obstruction.  No acute bone abnormality.  IMPRESSION: Postsurgical changes from a gastric bypass procedure. There are prominent mesenteric lymph  nodes with mild mesenteric edema. There is mild swirling of the mesentery which is likely related to the surgery but no evidence for a bowel obstruction. Small amount of free fluid in the pelvis. Small amount of fluid and mild mesenteric edema could represent an inflammatory process within the abdomen.   Electronically Signed   By: Markus Daft M.D.   On: 04/10/2014 17:01    Review of Systems  Constitutional: Negative for fever, chills and weight loss.  HENT: Negative for nosebleeds.   Eyes: Negative for blurred vision.  Respiratory: Negative for shortness of breath.   Cardiovascular: Negative for chest pain, palpitations, orthopnea and PND.       Denies DOE  Gastrointestinal: Positive for abdominal pain. Negative for vomiting, diarrhea, constipation, blood in stool and melena.  Genitourinary: Negative for dysuria and hematuria.  Musculoskeletal: Negative.   Skin: Negative for itching and rash.  Neurological: Negative for dizziness, focal weakness, seizures, loss of consciousness and headaches.  Endo/Heme/Allergies: Does not bruise/bleed easily.  Psychiatric/Behavioral: The patient is not nervous/anxious.     Blood pressure 120/68, pulse 77, temperature 98.4 F (36.9 C), temperature source Oral, resp. rate 16, last menstrual period 03/19/2014, SpO2 98 %. Physical Exam  Vitals reviewed. Constitutional: She is oriented to person, place, and time. Vital signs are normal. She appears well-developed and well-nourished.  Non-toxic appearance. She does not have a sickly appearance. She does not appear ill. No distress.  HENT:  Head: Normocephalic and atraumatic.  Right Ear: External ear normal.  Left Ear: External ear normal.  Eyes: Conjunctivae are normal. No  scleral icterus.  Neck: Normal range of motion. Neck supple. No tracheal deviation present. No thyromegaly present.  Cardiovascular: Normal rate and intact distal pulses.   Respiratory: Effort normal and breath sounds normal. No stridor. No respiratory distress.  GI: Soft. She exhibits no distension. There is no tenderness. There is no rebound and no guarding.  Soft, nontender  Musculoskeletal: She exhibits no edema or tenderness.  Neurological: She is alert and oriented to person, place, and time. She exhibits normal muscle tone.  Skin: Skin is warm and dry. No rash noted. She is not diaphoretic. No erythema. No pallor.  Psychiatric: She has a normal mood and affect. Her behavior is normal. Judgment and thought content normal.     Assessment/Plan Epigastric pain S/p LRYGB  i have reviewed CT. There is a swirl sign. 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 I have reviewed her CT with several of my partners who concur. Her symptoms have been going on since at least the 15th. I have recommended a diagnostic laparoscopy, with possible repair of internal hernia, possible upper endoscopy. Also included in the diffl is a marginal ulcer which to me is less likely. She is non toxic appearing, normal vitals, normal labs, nontender, and SB doesn't look ill on CT and contrast goes thru so I think she is safe to go home and we will plan on doing surgery on Wednesday afternoon. Had extensive discussion with pt and mother and discussed pros/cons of doing surgery on Wednesday instead of tonight. We discussed what to call for &/or return to ED for. I advised her to stay on full liquid diet and soft proteins.   Leighton Ruff. Redmond Pulling, MD, FACS General, Bariatric, & Minimally Invasive Surgery [facility], [place]   [facility] M 04/10/2014, 10:48 PM

## 2014-04-13 ENCOUNTER — Encounter (HOSPITAL_COMMUNITY): Payer: Self-pay | Admitting: General Surgery

## 2014-04-13 DIAGNOSIS — K458 Other specified abdominal hernia without obstruction or gangrene: Secondary | ICD-10-CM | POA: Diagnosis not present

## 2014-04-13 LAB — CBC
HCT: 35.3 % — ABNORMAL LOW (ref 36.0–46.0)
Hemoglobin: 12.2 g/dL (ref 12.0–15.0)
MCH: 29.7 pg (ref 26.0–34.0)
MCHC: 34.6 g/dL (ref 30.0–36.0)
MCV: 85.9 fL (ref 78.0–100.0)
Platelets: 283 10*3/uL (ref 150–400)
RBC: 4.11 MIL/uL (ref 3.87–5.11)
RDW: 11.6 % (ref 11.5–15.5)
WBC: 12.5 10*3/uL — ABNORMAL HIGH (ref 4.0–10.5)

## 2014-04-13 LAB — BASIC METABOLIC PANEL
ANION GAP: 7 (ref 5–15)
BUN: 6 mg/dL (ref 6–23)
CHLORIDE: 104 mmol/L (ref 96–112)
CO2: 26 mmol/L (ref 19–32)
CREATININE: 0.68 mg/dL (ref 0.50–1.10)
Calcium: 8.7 mg/dL (ref 8.4–10.5)
GFR calc Af Amer: >90 mL/min (ref >90)
GFR calc non Af Amer: >90 mL/min (ref >90)
Glucose, Bld: 134 mg/dL — ABNORMAL HIGH (ref 70–99)
POTASSIUM: 3.8 mmol/L (ref 3.5–5.1)
SODIUM: 137 mmol/L (ref 135–145)

## 2014-04-13 MED ORDER — OXYCODONE-ACETAMINOPHEN 5-325 MG PO TABS: 1.0000 | ORAL_TABLET | ORAL | Status: DC | PRN

## 2014-04-13 NOTE — Anesthesia Postprocedure Evaluation (Signed)
  Anesthesia Post-op Note  Patient: [name]  Procedure(s) Performed: Procedure(s) (LRB): REDUCTION OF INTERNAL HERNIA REPAIR (N/A) LAPAROSCOPY DIAGNOSTIC (N/A) LAPAROSCOPIC LYSIS OF ADHESIONS  Patient Location: PACU  Anesthesia Type: General  Level of Consciousness: awake and alert   Airway and Oxygen Therapy: Patient Spontanous Breathing  Post-op Pain: mild  Post-op Assessment: Post-op Vital signs reviewed, Patient's Cardiovascular Status Stable, Respiratory Function Stable, Patent Airway and No signs of Nausea or vomiting  Last Vitals:  Filed Vitals:   04/13/14 0900  BP: 112/68  Pulse: 63  Temp: 36.9 C  Resp: 16    Post-op Vital Signs: stable   Complications: No apparent anesthesia complications

## 2014-04-13 NOTE — Progress Notes (Signed)
Patient alert and oriented, Post op day 1.  Provided support and encouragement.  Encouraged pulmonary toilet, ambulation and small sips of liquids and to continue with diet.  Patient questioned the diet ordered, and explained the available food for the diet.  If patient is going to stay another day it may be appropriate to change her to a regular diet.  All questions answered.  Will continue to monitor.

## 2014-04-13 NOTE — Progress Notes (Signed)
1 Day Post-Op  Subjective: No n/v. Sore. Rt shoulder pain  Objective: Vital signs in last 24 hours: Temp:  [97.6 F (36.4 C)-98.5 F (36.9 C)] 98.1 F (36.7 C) (01/28 0600) Pulse Rate:  [60-98] 64 (01/28 0600) Resp:  [13-20] 16 (01/28 0600) BP: (113-140)/(62-88) 113/62 mmHg (01/28 0600) SpO2:  [97 %-100 %] 97 % (01/28 0600) Weight:  [155 lb 2 oz (70.364 kg)] 155 lb 2 oz (70.364 kg) (01/27 1014) Last BM Date: 04/12/14  Intake/Output from previous day: 01/27 0701 - 01/28 0700 In: 4150 [I.V.:4150] Out: 2845 [Urine:2835; Blood:10] Intake/Output this shift:    Alert, nad,  cta Soft, mild approp TTP; nd, incisions c/d/i  Lab Results:   Recent Labs  04/10/14 1425 04/13/14 0557  WBC 7.3 12.5*  HGB 14.0 12.2  HCT 40.1 35.3*  PLT 318 283   BMET  Recent Labs  04/10/14 1425 04/13/14 0557  NA 137 137  K 3.8 3.8  CL 103 104  CO2 25 26  GLUCOSE 91 134*  BUN 15 6  CREATININE 0.67 0.68  CALCIUM 9.3 8.7   PT/INR No results for input(s): LABPROT, INR in the last 72 hours. ABG No results for input(s): PHART, HCO3 in the last 72 hours.  Invalid input(s): PCO2, PO2  Studies/Results: No results found.  Anti-infectives: Anti-infectives    Start     Dose/Rate Route Frequency Ordered Stop   04/12/14 1019  cefoTEtan in Dextrose 5% (CEFOTAN) IVPB 2 g     2 g Intravenous On call to O.R. 04/12/14 1019 04/12/14 1228      Assessment/Plan: s/p Procedure(s): REDUCTION OF INTERNAL HERNIA REPAIR (N/A) LAPAROSCOPY DIAGNOSTIC (N/A) LAPAROSCOPIC LYSIS OF ADHESIONS  Adv diet as tolerated Discussed d/c instructions Probable dc later today once make sure tolerates diet  [name]. [name], MD, FACS General, Bariatric, & Minimally Invasive Surgery [name], [name]   LOS: 1 day    Karen Mcintyre,Karen Mcintyre 04/13/2014

## 2014-04-13 NOTE — Op Note (Signed)
NAMCleophus Molt:  DUNN, Karen Mcintyre                  ACCOUNT NO.:  [id number]  MEDICAL RECORD NO.:  [id number]  LOCATION:  1530                         [facility]  PHYSICIAN:  Karen SellaEric M. [name], MD, FACSDATE OF BIRTH:  1991-02-17  DATE OF PROCEDURE:  04/12/2014 DATE OF DISCHARGE:                              OPERATIVE REPORT   PREOPERATIVE DIAGNOSIS:  Epigastric pain, history of laparoscopic Roux- en-Y gastric bypass.  POSTOPERATIVE DIAGNOSIS:  Intraabdominal adhesions causing internal hernia.  PROCEDURE: 1. Diagnostic laparoscopy, laparoscopic lysis of adhesions, 1 hour. 2. Reduction of internal hernia.  SURGEON:  Karen Mcintyre M. [name], MD, FACS  ASSISTANT SURGEON:  Karen ParkMatthew B. Daphine DeutscherMartin, MD  ANESTHESIA:  General.  EBL:  10 mL.  INDICATIONS FOR PROCEDURE:  The patient is a very pleasant 24 year old, Caucasian female, who underwent a laparoscopic Roux-en-Y gastric bypass in May of 2015.  She had done extremely well.  She had lost over 100 pounds.  She was doing well until mid January when she started having some constant soreness in her upper abdomen.  At times, it would become severe generally after eating.  She came to the emergency room initially and was evaluated with abdominal plain film and abdominal ultrasound which were negative, and she was discharged home from the ER.  Her pain persisted and I was able to track her down and bring her back to the emergency room where a CT scan was performed.  There were some swirling of the mesenteric vessels and after review of the CT scan by myself and several my partners, I felt this was concerning for an internal hernia at the jejunojejunostomy mesenteric defect.  She was well appearing, and there was no sign of bowel ischemia or bowel obstruction on her CT scans, so allowed to go home with plans to bring her to the operating room a day and half later.  We discussed at length the risks and benefits of surgery including, but not limited to, bleeding,  infection, injury to surrounding structures, need to convert to an open procedure, blood clot formation, anesthesia risks, hernia formation, need for additional procedures.  She elected to proceed to surgery.  DESCRIPTION OF PROCEDURE:  After informed consent, she was taken to the operating room, placed supine on the operating table.  General endotracheal anesthesia was established.  A surgical time-out was performed.  A Foley catheter was placed.  Her abdomen was prepped and draped in usual standard surgical fashion with ChloraPrep.  She received IV antibiotics prior to skin incision.  She had also received 5000 units of subcutaneous heparin in the short-stay of the hospital.  Because of her large amount of weight loss and abdominal wall laxity, I decided to gain entry at the umbilicus with Hasson technique.  A small infraumbilical incision was made with #11 blade.  The fascia was grasped and lifted anteriorly.  Next, the fascia was incised with #11 blade and abdominal cavity was entered.  A pursestring suture consisting of 0 Vicryl was placed around the fascial edges and a 12-mm Hasson trocar was placed and pneumoperitoneum was smoothly established up to a patient pressure of 15 mmHg.  There was no sign of intestinal ischemia.  I placed a 5-mm trocar in the left upper abdomen through her old trocar site under direct visualization.  I then placed an 11 mm trocar in the right mid abdomen through an old trocar site as well as a 5 mm in the lateral right upper quadrant through an old trocar site.  At this point upon further inspection of the abdomen, there appeared to be a little bit of hyperemia to the mid small bowel.  The terminal ileum was normal, but there was a little bit of hyperemia to the mid small bowel, but there was no evidence of ischemia or venous congestion. I decided to start at the terminal ileum and run the bowel backwards proximally.  Starting at the terminal ileum and  running backwards, ran the small bowel using atraumatic bowel graspers.  Ran it all the way back applying counter at the jejunojejunostomy which appeared normal and appeared to be widely patent.  The biliary pancreatic limb was identified and traced back to the ligament of Treitz as well as the Roux limb was identified and traced up to the upper abdomen towards the gastric pouch.  At the jejunojejunostomy, there is scarring along the mesenteric closure at the jejunojejunostomy.  There was no sign of mesentery defect.  Looking at the transverse colon, there was an omental band that was tethered to the base of the small bowel mesentery.  This was an area of where clearly small bowel to get trapped.  It was lysed with Harmonic Scalpel.  After reducing the bowel, it then appeared that for some reason that the Roux limb may be twisted as it went up into the upper abdomen in the midportion.  It appeared that some of the omentum that was extending from the left side of the abdomen to the right side of the abdomen was causing the Roux limb to twist.  I took down and freed this omentum with Harmonic Scalpel and it allowed me to untwist the Roux limb.  The mesentery was now facing the left side of the abdomen.  I identified the blunt end of the Roux limb.  It was on the left side of the abdomen how we typically configure a Roux-en-Y bypass patients at the time of initial surgery.  There were some interloop adhesions between the Roux limb and the common channel which were taken down with EndoShears.  We ended up running the bowel, a total of 6 times in multiple directions from the Roux limb down to the jejunojejunostomy and at the BP limb as well as down the common limb.  We also started back at the terminal ileum and ran back proximally.  She had very little intraabdominal fat because of her extreme weight loss.  There was an open area behind the Roux limb as it passed over the transverse colon  as expected, but it did not appear that this was the sign of internal hernia.  At this point, we had been running her intestine for around 2 hours.  We find no other abnormality.  At this point, I decided to stop the procedure.  The 11 mm trocar in the right mid abdomen was removed and the fascia was brought together with a 0 Vicryl using a suture passer.  We then removed Hasson trocar and tied down the previously placed pursestring suture thus obliterating the fascial defect.  I did elect to place an additional interrupted 0 Vicryl using the suture passer in this location for additional fascial reapproximation.  Local was infiltrated  at both the facial closures with 0.25% Marcaine with epinephrine.  Pneumoperitoneum was released.  The 2 remaining trocars were removed.  Skin incisions were closed with a 4-0 Monocryl in a subcuticular fashion followed by application of liquid band exceed.  The Foley catheter was removed.  The patient tolerated the procedure well.  There were no immediate complications.     Karen Sella. Andrey Campanile, MD, FACS     EMW/MEDQ  D:  04/12/2014  T:  04/13/2014  Job:  [id number]

## 2014-04-13 NOTE — Discharge Summary (Signed)
Physician Discharge Summary  Karen Mcintyre ZOX:[id number] DOB: 01/30/1991 DOA: 04/12/2014  PCP: No PCP Per Patient  Admit date: 04/12/2014 Discharge date: 04/13/2014  Recommendations for Outpatient Follow-up:   Follow-up Information    Follow up with Karen Ina, Karen Mcintyre. Schedule an appointment as soon as possible for a visit in 2 weeks.   Specialty:  General Surgery   Why:  For wound re-check   Contact information:   [address] [place] [address] [phone]      Discharge Diagnoses:  1. Epigastric pain 2. Internal hernia due to adhesions 3. H/o laparoscopic roux en y gastric bypass  Surgical Procedure: diagnostic laparoscopy, laparoscopic lysis of adhesions, reduction of internal hernia  Discharge Condition: good Disposition: home  Diet recommendation: bariatric fulls  Filed Weights   04/12/14 1014  Weight: 155 lb 2 oz (70.364 kg)    History of present illness:  24 yo WF s/p LRYGB 07/2013 who was doing well until around 1/15 when developed some soreness in upper abdomen. It was constant and persistent which prompted her to go to the ED on 1/17 where an abd u/s and plain films were done along with labs which were normal and she was sent home. The soreness persisted, it was constant, it would occasionally worsen with PO. We were able to contact her today and advised her to come to the ED to get checked out. No NSAIDS. Sometimes standing straight up worsens it, curling up makes it better. Mild nausea. No emesis. +flatus and BM. No fever/chills/melena/hematochezia.   Hospital Course:  CT was concerning for possible internal hernia. After reviewing with my bariatric partners, there was general consensus of a high likelihood of internal hernia. She was allowed to go home based on factors explained in prior note with plans made for diagnostic laparoscopy about 36hrs later. She was brought back to the hospital and taken to the OR for the above mentioned procedure. Please  see op note. Postoperatively she did quite well. She was started on clear liquids and her diet was rapidly advanced. While she reported soreness at her incisions and shoulder, her pain otherwise was minimal and not like what she reported preoperatively. Her diet was advanced which she tolerated. In the afternoon on POD 1 she had tolerated 3 meals without nausea, worsening abd pain. Her vitals were stable. She was ambulating. She was deemed stable for dc. We had discussed discharge instructions.   Discharge Instructions  Discharge Instructions    Ambulate hourly while awake    Complete by:  As directed      Call Karen Mcintyre for:  difficulty breathing, headache or visual disturbances    Complete by:  As directed      Call Karen Mcintyre for:  persistant dizziness or light-headedness    Complete by:  As directed      Call Karen Mcintyre for:  persistant nausea and vomiting    Complete by:  As directed      Call Karen Mcintyre for:  redness, tenderness, or signs of infection (pain, swelling, redness, odor or green/yellow discharge around incision site)    Complete by:  As directed      Call Karen Mcintyre for:  severe uncontrolled pain    Complete by:  As directed      Call Karen Mcintyre for:  temperature >101 F    Complete by:  As directed      Diet bariatric full liquid    Complete by:  As directed   Can advance to regular  diet     Incentive spirometry    Complete by:  As directed   Perform hourly while awake            Medication List    STOP taking these medications        omeprazole 20 MG capsule  Commonly known as:  PRILOSEC      TAKE these medications        HAIR/SKIN/NAILS PO  Take 1 tablet by mouth daily.     multivitamin with minerals Tabs tablet  Take 1 tablet by mouth daily.     ondansetron 4 MG tablet  Commonly known as:  ZOFRAN  Take 1 tablet (4 mg total) by mouth every 6 (six) hours.     oxyCODONE-acetaminophen 5-325 MG per tablet  Commonly known as:  PERCOCET/ROXICET  Take 1-2 tablets by mouth every 4 (four) hours as  needed for moderate pain.     pantoprazole 40 MG tablet  Commonly known as:  PROTONIX  Take 1 tablet (40 mg total) by mouth daily.           Follow-up Information    Follow up with Karen Mcintyre,Karen Martis Mcintyre, Karen Mcintyre. Schedule an appointment as soon as possible for a visit in 2 weeks.   Specialty:  General Surgery   Why:  For wound re-check   Contact information:   [address] [place] [address] [phone]        The results of significant diagnostics from this hospitalization (including imaging, microbiology, ancillary and laboratory) are listed below for reference.    Significant Diagnostic Studies: Koreas Abdomen Complete  04/02/2014   CLINICAL DATA:  Initial encounter for epigastric abdominal pain that radiates to right upper quadrant for 2 days.  EXAM: ULTRASOUND ABDOMEN COMPLETE  COMPARISON:  Plain films earlier today.  Ultrasound 05/06/2013.  FINDINGS: Gallbladder: No gallstones or wall thickening visualized. No sonographic Murphy sign noted.  Common bile duct: Diameter: Normal, 2 mm.  Liver: No focal lesion identified. Within normal limits in parenchymal echogenicity.  IVC: No abnormality visualized.  Pancreas: Poorly visualized due to overlying bowel gas.  Spleen: Size and appearance within normal limits.  Right Kidney: Length: 10.9 cm. Echogenicity within normal limits. No mass or hydronephrosis visualized.  Left Kidney: Length: 11.6 cm. Echogenicity within normal limits. No mass or hydronephrosis visualized.  Abdominal aorta: No aneurysm visualized.  Other findings: No ascites.  Exam mildly degraded by overlying bowel gas.  IMPRESSION: 1.  No acute abdominal process. 2. Mild degradation secondary to overlying bowel gas.   Electronically Signed   By: Jeronimo GreavesKyle  Talbot Mcintyre.D.   On: 04/02/2014 13:03   Ct Abdomen Pelvis W Contrast  04/10/2014   CLINICAL DATA:  24 year old with ongoing epigastric abdominal pain since last Wednesday with nausea.  EXAM: CT ABDOMEN AND PELVIS WITH CONTRAST   TECHNIQUE: Multidetector CT imaging of the abdomen and pelvis was performed using the standard protocol following bolus administration of intravenous contrast.  CONTRAST:  25mL OMNIPAQUE IOHEXOL 300 MG/ML SOLN, 80mL OMNIPAQUE IOHEXOL 300 MG/ML SOLN  COMPARISON:  None.  FINDINGS: Lung bases are clear.  No evidence for free intraperitoneal air.  Postsurgical changes compatible with a gastric bypass procedure. There is no evidence for an obstructive bowel process. No significant bowel wall thickening.  Normal appearance of the liver, gallbladder and portal venous system. Normal appearance of the spleen, pancreas, adrenal glands and both kidneys. No gross abnormality to the uterus. Limited evaluation of the ovaries and adnexal tissue. There  is a prominent follicle or involuting cyst in the right ovary measuring roughly 1.5 cm, sequence 2, image 67. Normal appearance of the appendix.  There is a small amount of free fluid in the pelvis along the anterior aspect. There are prominent central mesenteric lymph nodes. There appears to be mild mesenteric edema, best seen on sequence 2, image 41. The configuration of the mesentery is irregular and likely secondary to the abdominal surgery. Mild swirling of the central abdominal mesentery without evidence for a bowel obstruction.  No acute bone abnormality.  IMPRESSION: Postsurgical changes from a gastric bypass procedure. There are prominent mesenteric lymph nodes with mild mesenteric edema. There is mild swirling of the mesentery which is likely related to the surgery but no evidence for a bowel obstruction. Small amount of free fluid in the pelvis. Small amount of fluid and mild mesenteric edema could represent an inflammatory process within the abdomen.   Electronically Signed   By: Richarda Overlie Mcintyre.D.   On: 04/10/2014 17:01   Dg Abd Acute W/chest  04/02/2014   CLINICAL DATA:  Epigastric abdominal pain for the past 3 days. 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  EXAM:  ACUTE ABDOMEN SERIES (ABDOMEN 2 VIEW & CHEST 1 VIEW)  COMPARISON:  Chest radiographs dated 05/06/2013.  FINDINGS: Normal sized heart. Clear lungs. Normal bowel gas pattern without free peritoneal air. Surgical staples in the medial aspect of the left upper abdomen. Unremarkable bones.  IMPRESSION: No acute abnormality.   Electronically Signed   By: Gordan Payment Mcintyre.D.   On: 04/02/2014 11:08    Microbiology: No results found for this or any previous visit (from the past 240 hour(s)).   Labs: Basic Metabolic Panel:  Recent Labs Lab 04/10/14 1425 04/13/14 0557  NA 137 137  K 3.8 3.8  CL 103 104  CO2 25 26  GLUCOSE 91 134*  BUN 15 6  CREATININE 0.67 0.68  CALCIUM 9.3 8.7   Liver Function Tests:  Recent Labs Lab 04/10/14 1425  AST 16  ALT 13  ALKPHOS 50  BILITOT 0.5  PROT 7.6  ALBUMIN 4.7    Recent Labs Lab 04/10/14 1425  LIPASE 25   No results for input(s): AMMONIA in the last 168 hours. CBC:  Recent Labs Lab 04/10/14 1425 04/13/14 0557  WBC 7.3 12.5*  NEUTROABS 4.7  --   HGB 14.0 12.2  HCT 40.1 35.3*  MCV 85.9 85.9  PLT 318 283   Cardiac Enzymes: No results for input(s): CKTOTAL, CKMB, CKMBINDEX, TROPONINI in the last 168 hours. BNP: BNP (last 3 results) No results for input(s): PROBNP in the last 8760 hours. CBG: No results for input(s): GLUCAP in the last 168 hours.  Active Problems:   Internal hernia   Time coordinating discharge: 15 min  Signed:  Atilano Ina, Karen Mcintyre [facility], [place] [phone] 04/13/2014, 10:21 PM

## 2014-04-13 NOTE — Discharge Instructions (Signed)
[facility] °LAPAROSCOPIC SURGERY: POST OP INSTRUCTIONS °Always review your discharge instruction sheet given to you by the facility where your surgery was performed. °IF YOU HAVE DISABILITY OR FAMILY LEAVE FORMS, YOU MUST BRING THEM TO THE OFFICE FOR PROCESSING.   °DO NOT GIVE THEM TO YOUR DOCTOR. ° °1. A prescription for pain medication may be given to you upon discharge.  Take your pain medication as prescribed, if needed.  If narcotic pain medicine is not needed, then you may take acetaminophen (Tylenol) or ibuprofen (Advil) as needed. °2. Take your usually prescribed medications unless otherwise directed. °3. If you need a refill on your pain medication, please contact your pharmacy.  They will contact our office to request authorization. Prescriptions will not be filled after 5pm or on week-ends. °4. You should follow a light diet the first few days after arrival home, such as soup and crackers, etc.  Be sure to include lots of fluids daily. °5. Most patients will experience some swelling and bruising in the area of the incisions.  Ice packs will help.  Swelling and bruising can take several days to resolve.  °6. It is common to experience some constipation if taking pain medication after surgery.  Increasing fluid intake and taking a stool softener (such as Colace) will usually help or prevent this problem from occurring.  A mild laxative (Milk of Magnesia or Miralax) should be taken according to package instructions if there are no bowel movements after 48 hours. °7. Unless discharge instructions indicate otherwise, you may remove your bandages 24-48 hours after surgery, and you may shower at that time.  You may have steri-strips (small skin tapes) in place directly over the incision.  These strips should be left on the skin for 7-10 days.  If your surgeon used skin glue on the incision, you may shower in 24 hours.  The glue will flake off over the next 2-3 weeks.  Any sutures or  staples will be removed at the office during your follow-up visit. °8. ACTIVITIES:  You may resume regular (light) daily activities beginning the next day--such as daily self-care, walking, climbing stairs--gradually increasing activities as tolerated.  You may have sexual intercourse when it is comfortable.  Refrain from any heavy lifting or straining until approved by your doctor. °a. You may drive when you are no longer taking prescription pain medication, you can comfortably wear a seatbelt, and you can safely maneuver your car and apply brakes. °9. You should see your doctor in the office for a follow-up appointment approximately 2-3 weeks after your surgery.  Make sure that you call for this appointment within a day or two after you arrive home to insure a convenient appointment time. °10. OTHER INSTRUCTIONS:  °WHEN TO CALL YOUR DOCTOR: °1. Fever over 101.0 °2. Inability to urinate °3. Continued bleeding from incision. °4. Increased pain, redness, or drainage from the incision. °5. Increasing abdominal pain ° °The clinic staff is available to answer your questions during regular business hours.  Please don’t hesitate to call and ask to speak to one of the nurses for clinical concerns.  If you have a medical emergency, go to the nearest emergency room or call 911.  A surgeon from [facility] is always on call at the hospital. °[address], [address], [place], [place]  [address] ? [address] [address], [place], [place]   [address] °[phone] ? [phone] ? FAX [phone] °Web site: [email or web address] ° °

## 2014-04-21 ENCOUNTER — Other Ambulatory Visit (INDEPENDENT_AMBULATORY_CARE_PROVIDER_SITE_OTHER): Payer: Self-pay | Admitting: General Surgery

## 2014-04-21 ENCOUNTER — Other Ambulatory Visit (INDEPENDENT_AMBULATORY_CARE_PROVIDER_SITE_OTHER): Payer: Self-pay

## 2014-04-21 DIAGNOSIS — Z9884 Bariatric surgery status: Secondary | ICD-10-CM

## 2014-04-21 DIAGNOSIS — R1030 Lower abdominal pain, unspecified: Secondary | ICD-10-CM

## 2014-04-24 ENCOUNTER — Ambulatory Visit
Admission: RE | Admit: 2014-04-24 | Discharge: 2014-04-24 | Disposition: A | Discharge status: Home / Self Care | Payer: BLUE CROSS/BLUE SHIELD | Source: Ambulatory Visit | Attending: General Surgery | Admitting: General Surgery

## 2014-05-31 ENCOUNTER — Ambulatory Visit (INDEPENDENT_AMBULATORY_CARE_PROVIDER_SITE_OTHER): Payer: BLUE CROSS/BLUE SHIELD | Admitting: Emergency Medicine

## 2014-05-31 VITALS — BP 114/76 | HR 76 | Temp 98.2°F | Resp 16 | Ht 64.5 in | Wt 145.4 lb

## 2014-05-31 DIAGNOSIS — N3001 Acute cystitis with hematuria: Secondary | ICD-10-CM

## 2014-05-31 LAB — POCT URINALYSIS DIPSTICK
Blood, UA: NEGATIVE
Glucose, UA: 100
Ketones, UA: 15
NITRITE UA: POSITIVE
PH UA: 6
Spec Grav, UA: 1.025
UROBILINOGEN UA: 4

## 2014-05-31 LAB — POCT UA - MICROSCOPIC ONLY
Casts, Ur, LPF, POC: NEGATIVE
Crystals, Ur, HPF, POC: NEGATIVE
Yeast, UA: NEGATIVE

## 2014-05-31 MED ORDER — PHENAZOPYRIDINE HCL 200 MG PO TABS: 200.0000 mg | ORAL_TABLET | Freq: Three times a day (TID) | ORAL | Status: DC | PRN

## 2014-05-31 MED ORDER — CIPROFLOXACIN HCL 500 MG PO TABS: 500.0000 mg | ORAL_TABLET | Freq: Two times a day (BID) | ORAL | Status: DC

## 2014-05-31 NOTE — Patient Instructions (Signed)

## 2014-05-31 NOTE — Progress Notes (Signed)
[facility] [address], [place] [address] [phone]  Date:  05/31/2014   Name:  Karen Mcintyre   DOB:  09/16/1990   MRN:  [id number]  PCP:  No PCP Per Patient    Chief Complaint: Urinary Tract Infection   History of Present Illness:  Karen Mcintyre is a 24 y.o. very pleasant female patient who presents with the following:  Has cloudy urine with a strong odor No dysuria, urgency.  Does have frequency No back pain No abdominal pain No fever or chills No vaginal discharge or bleeding No improvement with over the counter medications or other home remedies.  Denies other complaint or health concern today.   Patient Active Problem List   Diagnosis Date Noted  . Internal hernia 04/12/2014  . 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 Morbid obesity 08/01/2013  . ASCUS (atypical squamous cells of undetermined significance) on Pap smear 02/23/2012  . Contraception management 02/11/2012  . Obesity, Class III, BMI 40-49.9 (morbid obesity) 08/26/2011    Past Medical History  Diagnosis Date  . Obesity   . Obesity   . Epigastric pain 03-2014    Possible Internal Hernia    Past Surgical History  Procedure Laterality Date  . Wisdom teeth extraction    . Gastric roux-en-y N/A 08/01/2013    Procedure: LAPAROSCOPIC ROUX-EN-Y GASTRIC BYPASS WITH UPPER ENDOSCOPY WITH LAPAROSCOPIC REPAIR OF HIATAL HERNIA;  Surgeon: Atilano InaEric M Wilson, MD;  Location: [facility];  Service: General;  Laterality: N/A;  . Laparoscopic internal hernia repair N/A 04/12/2014    Procedure: REDUCTION OF INTERNAL HERNIA REPAIR;  Surgeon: Atilano InaEric M Wilson, MD;  Location: [facility];  Service: General;  Laterality: N/A;  . Laparoscopy N/A 04/12/2014    Procedure: LAPAROSCOPY DIAGNOSTIC;  Surgeon: Atilano InaEric M Wilson, MD;  Location: [facility];  Service: General;  Laterality: N/A;  . Laparoscopic lysis of adhesions  04/12/2014    Procedure: LAPAROSCOPIC LYSIS OF ADHESIONS;  Surgeon: Atilano InaEric M Wilson, MD;   Location: [facility];  Service: General;;    History  Substance Use Topics  . Smoking status: Never Smoker   . Smokeless tobacco: Never Used  . Alcohol Use: No    Family History  Problem Relation Age of Onset  . Hyperlipidemia Mother   . Alcohol abuse Father   . Cancer Paternal Grandmother     lung cancer  . Obesity Other     Allergies  Allergen Reactions  . Diphenhydramine Hcl     REACTION: urticaria (hives)  . Nitrofurantoin     REACTION: unspecified    Medication list has been reviewed and updated.  No current outpatient prescriptions on file prior to visit.   No current facility-administered medications on file prior to visit.    Review of Systems:  As per HPI, otherwise negative.    Physical Examination: Filed Vitals:   05/31/14 1855  BP: 114/76  Pulse: 76  Temp: 98.2 F (36.8 C)  Resp: 16   Filed Vitals:   05/31/14 1855  Height: 5' 4.5" (1.638 m)  Weight: 145 lb 6.4 oz (65.953 kg)   Body mass index is 24.58 kg/(m^2). Ideal Body Weight: Weight in (lb) to have BMI = 25: 147.6  GEN: WDWN, NAD, Non-toxic, A & O x 3 HEENT: Atraumatic, Normocephalic. Neck supple. No masses, No LAD. Ears and Nose: No external deformity. CV: RRR, No M/G/R. No JVD. No thrill. No extra heart sounds. PULM: CTA B, no wheezes, crackles, rhonchi.  No retractions. No resp. distress. No accessory muscle use. ABD: S, NT, ND, +BS. No rebound. No HSM. EXTR: No c/c/e NEURO Normal gait.  PSYCH: Normally interactive. Conversant. Not depressed or anxious appearing.  Calm demeanor.    Assessment and Plan: Cystitis cipro  Signed,  Phillips Odor, MD   Results for orders placed or performed in visit on 05/31/14  POCT urinalysis dipstick  Result Value Ref Range   Color, UA yellow    Clarity, UA cloudy    Glucose, UA 100    Bilirubin, UA small    Ketones, UA 15    Spec Grav, UA 1.025    Blood, UA neg    pH, UA 6.0    Protein, UA trace    Urobilinogen, UA 4.0    Nitrite,  UA positive    Leukocytes, UA moderate (2+)   POCT UA - Microscopic Only  Result Value Ref Range   WBC, Ur, HPF, POC 25-35    RBC, urine, microscopic 0-2    Bacteria, U Microscopic 2+    Mucus, UA 2+    Epithelial cells, urine per micros 5-10    Crystals, Ur, HPF, POC neg    Casts, Ur, LPF, POC neg    Yeast, UA neg

## 2014-08-08 ENCOUNTER — Encounter: Payer: Self-pay | Admitting: Family Medicine

## 2014-08-08 ENCOUNTER — Ambulatory Visit (INDEPENDENT_AMBULATORY_CARE_PROVIDER_SITE_OTHER): Payer: BLUE CROSS/BLUE SHIELD | Admitting: Family Medicine

## 2014-08-08 VITALS — BP 108/74 | HR 72 | Ht 64.5 in | Wt 138.0 lb

## 2014-08-08 DIAGNOSIS — Z3040 Encounter for surveillance of contraceptives, unspecified: Secondary | ICD-10-CM | POA: Diagnosis not present

## 2014-08-08 DIAGNOSIS — Z30019 Encounter for initial prescription of contraceptives, unspecified: Secondary | ICD-10-CM | POA: Diagnosis not present

## 2014-08-08 DIAGNOSIS — Z309 Encounter for contraceptive management, unspecified: Secondary | ICD-10-CM

## 2014-08-08 LAB — POCT URINE PREGNANCY: Preg Test, Ur: NEGATIVE

## 2014-08-08 MED ORDER — ETONOGESTREL 68 MG ~~LOC~~ IMPL
68.0000 mg | DRUG_IMPLANT | Freq: Once | SUBCUTANEOUS | Status: AC
  Administered 2014-08-08: 68 mg via SUBCUTANEOUS

## 2014-08-08 NOTE — Addendum Note (Signed)
Addended by: Jone BasemanFLEEGER, JESSICA D on: 08/08/2014 11:26 AM   Modules accepted: Orders

## 2014-08-08 NOTE — Patient Instructions (Signed)
Etonogestrel implant What is this medicine? ETONOGESTREL (et oh noe JES trel) is a contraceptive (birth control) device. It is used to prevent pregnancy. It can be used for up to 3 years. This medicine may be used for other purposes; ask your health care provider or pharmacist if you have questions. COMMON BRAND NAME(S): Implanon, Nexplanon What should I tell my health care provider before I take this medicine? They need to know if you have any of these conditions: -abnormal vaginal bleeding -blood vessel disease or blood clots -cancer of the breast, cervix, or liver -depression -diabetes -gallbladder disease -headaches -heart disease or recent heart attack -high blood pressure -high cholesterol -kidney disease -liver disease -renal disease -seizures -tobacco smoker -an unusual or allergic reaction to etonogestrel, other hormones, anesthetics or antiseptics, medicines, foods, dyes, or preservatives -pregnant or trying to get pregnant -breast-feeding How should I use this medicine? This device is inserted just under the skin on the inner side of your upper arm by a health care professional. Talk to your pediatrician regarding the use of this medicine in children. Special care may be needed. Overdosage: If you think you've taken too much of this medicine contact a poison control center or emergency room at once. Overdosage: If you think you have taken too much of this medicine contact a poison control center or emergency room at once. NOTE: This medicine is only for you. Do not share this medicine with others. What if I miss a dose? This does not apply. What may interact with this medicine? Do not take this medicine with any of the following medications: -amprenavir -bosentan -fosamprenavir This medicine may also interact with the following medications: -barbiturate medicines for inducing sleep or treating seizures -certain medicines for fungal infections like ketoconazole and  itraconazole -griseofulvin -medicines to treat seizures like carbamazepine, felbamate, oxcarbazepine, phenytoin, topiramate -modafinil -phenylbutazone -rifampin -some medicines to treat HIV infection like atazanavir, indinavir, lopinavir, nelfinavir, tipranavir, ritonavir -St. John's wort This list may not describe all possible interactions. Give your health care provider a list of all the medicines, herbs, non-prescription drugs, or dietary supplements you use. Also tell them if you smoke, drink alcohol, or use illegal drugs. Some items may interact with your medicine. What should I watch for while using this medicine? This product does not protect you against HIV infection (AIDS) or other sexually transmitted diseases. You should be able to feel the implant by pressing your fingertips over the skin where it was inserted. Tell your doctor if you cannot feel the implant. What side effects may I notice from receiving this medicine? Side effects that you should report to your doctor or health care professional as soon as possible: -allergic reactions like skin rash, itching or hives, swelling of the face, lips, or tongue -breast lumps -changes in vision -confusion, trouble speaking or understanding -dark urine -depressed mood -general ill feeling or flu-like symptoms -light-colored stools -loss of appetite, nausea -right upper belly pain -severe headaches -severe pain, swelling, or tenderness in the abdomen -shortness of breath, chest pain, swelling in a leg -signs of pregnancy -sudden numbness or weakness of the face, arm or leg -trouble walking, dizziness, loss of balance or coordination -unusual vaginal bleeding, discharge -unusually weak or tired -yellowing of the eyes or skin Side effects that usually do not require medical attention (Report these to your doctor or health care professional if they continue or are bothersome.): -acne -breast pain -changes in  weight -cough -fever or chills -headache -irregular menstrual bleeding -itching, burning, and   vaginal discharge -pain or difficulty passing urine -sore throat This list may not describe all possible side effects. Call your doctor for medical advice about side effects. You may report side effects to FDA at 1-800-FDA-1088. Where should I keep my medicine? This drug is given in a hospital or clinic and will not be stored at home. NOTE: This sheet is a summary. It may not cover all possible information. If you have questions about this medicine, talk to your doctor, pharmacist, or health care provider.  2015, [employer]vier/Gold Standard. (2011-09-08 15:37:45)  

## 2014-08-08 NOTE — Progress Notes (Addendum)
Patient ID: Karen Mcintyre, female   DOB: 10/24/1990, 24 y.o.   MRN: [id number] Subjective:    Karen Augustamber M Mcintyre is a 24 y.o. female who presents for contraception counseling. The patient has no complaints today. The patient is sexually active. Pertinent past medical history: none. Uses condom regularly. [facility] and she stated it was normal.  Menstrual History: OB History    No data available      Menarche age: [age]  Patient's last menstrual period was 08/01/2014 (exact date).    The following portions of the patient's history were reviewed and updated as appropriate: allergies, current medications, past family history, past medical history, past social history, past surgical history and problem list.  Review of Systems A comprehensive review of systems was negative.   Objective:    BP 108/74 mmHg  Pulse 72  Ht 5' 4.5" (1.638 m)  Wt 138 lb (62.596 kg)  BMI 23.33 kg/m2  LMP 08/01/2014 (Exact Date) Lungs: clear to auscultation bilaterally Heart: regular rate and rhythm, S1, S2 normal, no murmur, click, rub or gallop Abdomen: soft, non-tender; bowel sounds normal; no masses,  no organomegaly Extremities: extremities normal, atraumatic, no cyanosis or edema Neurologic: Alert and oriented X 3, normal strength and tone. Normal symmetric reflexes. Normal coordination and gait   Assessment:    24 y.o., starting Nexplanon, no contraindications.   Plan:   PRE-OP DIAGNOSIS: desired long-term, reversible contraception  POST-OP DIAGNOSIS: Same  PROCEDURE: Nexplanon  placement Performing Physician: Dr Lum BabeEniola   PROCEDURE:  Site left arm.       Serial # [id number] Sterile Preparation:  Insertion site cleaned with betadine and alcohol swab. Insertion site was selected 8 cm from medial epicondyle and marked along with guiding site using sterile marker Procedure area was prepped and draped in a sterile fashion. 5 mL of 1% lidocaine  with  epinephrine used for subcutaneous anesthesia. Anesthesia confirmed.  Nexplanon  trocar was inserted subcutaneously and then Nexplanon  capsule delivered subcutaneously Trocar was removed from the insertion site. Nexplanon  capsule was palpated by provider and patient to assure satisfactory placement. Estimated blood loss of 0  mL Dressings applied:     Gauze/Tape     Followup: The patient tolerated the procedure well without complications.  Standard post-procedure care is explained and return precautions are given. She is advised to you condom for backup for the next 2 weeks.   Contraception: Nexplanon. Pregnancy test, result: negative. Nexplanon to be replaced by 08/07/17   Obtain PAP report from health department.  Lot #: [id number]

## 2014-08-08 NOTE — Assessment & Plan Note (Signed)
Counseling done on various birth control. She tried Depo in the past which caused weight gain. She opted for Nexplanon. S/E of Nexplanon and complication of insertion discussed.

## 2015-03-20 ENCOUNTER — Ambulatory Visit (INDEPENDENT_AMBULATORY_CARE_PROVIDER_SITE_OTHER): Payer: BLUE CROSS/BLUE SHIELD | Admitting: Family Medicine

## 2015-03-20 ENCOUNTER — Encounter: Payer: Self-pay | Admitting: Family Medicine

## 2015-03-20 VITALS — BP 113/75 | HR 79 | Temp 98.9°F | Resp 16 | Wt 132.6 lb

## 2015-03-20 DIAGNOSIS — Z114 Encounter for screening for human immunodeficiency virus [HIV]: Secondary | ICD-10-CM

## 2015-03-20 DIAGNOSIS — Z3046 Encounter for surveillance of implantable subdermal contraceptive: Secondary | ICD-10-CM | POA: Diagnosis not present

## 2015-03-20 DIAGNOSIS — Z308 Encounter for other contraceptive management: Secondary | ICD-10-CM

## 2015-03-20 MED ORDER — NORGESTIM-ETH ESTRAD TRIPHASIC 0.18/0.215/0.25 MG-25 MCG PO TABS: 1.0000 | ORAL_TABLET | Freq: Every day | ORAL | Status: DC

## 2015-03-20 NOTE — Patient Instructions (Addendum)
Incision Care °An incision is when a surgeon cuts into your body. After surgery, the incision needs to be cared for properly to prevent infection.  °HOW TO CARE FOR YOUR INCISION °· Take medicines only as directed by your health care provider. °· There are many different ways to close and cover an incision, including stitches, skin glue, and adhesive strips. Follow your health care provider's instructions on: °¨ Incision care. °¨ Bandage (dressing) changes and removal. °¨ Incision closure removal. °· Do not take baths, swim, or use a hot tub until your health care provider approves. You may shower as directed by your health care provider. °· Resume your normal diet and activities as directed. °· Use anti-itch medicine (such as an antihistamine) as directed by your health care provider. The incision may itch while it is healing. Do not pick or scratch at the incision. °· Drink enough fluid to keep your urine clear or pale yellow. °SEEK MEDICAL CARE IF:  °· You have drainage, redness, swelling, or pain at your incision site. °· You have muscle aches, chills, or a general ill feeling. °· You notice a bad smell coming from the incision or dressing. °· Your incision edges separate after the sutures, staples, or skin adhesive strips have been removed. °· You have persistent nausea or vomiting. °· You have a fever. °· You are dizzy. °SEEK IMMEDIATE MEDICAL CARE IF:  °· You have a rash. °· You faint. °· You have difficulty breathing. °MAKE SURE YOU:  °· Understand these instructions. °· Will watch your condition. °· Will get help right away if you are not doing well or get worse. °  °This information is not intended to replace advice given to you by your health care provider. Make sure you discuss any questions you have with your health care provider. °  °Document Released: 09/20/2004 Document Revised: 03/24/2014 Document Reviewed: 04/27/2013 °Elsevier Interactive Patient Education ©2016 Elsevier Inc. ° °

## 2015-03-20 NOTE — Progress Notes (Addendum)
Patient ID: Karen Mcintyre, female   DOB: 08/09/1990, 25 y.o.   MRN: [id number] CC: Nexplanon removal. HPI: She has Neplanon inserted less than 1 year ago. She is getting married in April and she will like to get pregnant although not immediately. She feels she has hormonal disturbance with Nexplanon which affects her mood sometimes.[facility] whenever she is ready to get pregnant.  Current Outpatient Prescriptions on File Prior to Visit  Medication Sig Dispense Refill  . Multiple Vitamin (MULTIVITAMIN) tablet Take 1 tablet by mouth daily. Reported on 03/20/2015    . vitamin B-12 (CYANOCOBALAMIN) 100 MCG tablet Take 100 mcg by mouth daily. Reported on 03/20/2015     No current facility-administered medications on file prior to visit.   Past Medical History  Diagnosis Date  . Obesity   . Obesity   . Epigastric pain 03-2014    Possible Internal Hernia   Filed Vitals:   03/20/15 0903  BP: 113/75  Pulse: 79  Temp: 98.9 F (37.2 C)  TempSrc: Oral  Resp: 16  Weight: 132 lb 9.6 oz (60.147 kg)   Review of Systems  Constitutional: Negative.   Respiratory: Negative.   Cardiovascular: Negative.   Genitourinary: Negative.   All other systems reviewed and are negative.  Physical Exam  Constitutional: She appears well-developed. No distress.  Cardiovascular: Normal rate and regular rhythm.  Exam reveals no friction rub.   No murmur heard. Pulmonary/Chest: Effort normal and breath sounds normal. No respiratory distress. She has no wheezes.  Skin: No rash noted.  Nexplanon device palpable on her left arm.  Nursing note and vitals reviewed.    PRE-OP DIAGNOSIS: Nexplanon, desire for change of contraception   POST-OP DIAGNOSIS: Same   PROCEDURE: Nexplanon Removal  Performing Physician:Cassian Torelli Lum BabeEniola, MD, MPH  PROCEDURE:  Anesthesia: 2% Lidocaine w/ epinephrine 5 ml   Procedure: Consent obtained both verbally and in writing. A time-out was  performed prior to initiating procedure to be sure of right patient and right location. The area surrounding the Nexplanon was prepared with Choloraprep and draped in the usual sterile manner. The site was anesthetized with lidocaine. A perpendicular skin incision was made over the distal aspect of the device. The capsule lysed sharply and the device removed using a hemostat. Hemostasis was assured. The site was dressed with SteriStrips and a pressure dressing. The patient tolerated the procedure well.   Followup: The patient tolerated the procedure well without complications. Standard post-procedure care is explained and return precautions are given. Contraception is advised until conception is desired. OCP prescribed.

## 2015-05-13 IMAGING — US US ABDOMEN COMPLETE
1 series · 14 of 25 positions shown · non-contrast
Comparison: Plain films earlier today.  Ultrasound 05/06/2013.

CLINICAL DATA: Initial encounter for epigastric abdominal pain that
radiates to right upper quadrant for 2 days.

EXAM:
ULTRASOUND ABDOMEN COMPLETE

[Series 1: us abdomen complete · 0.22mm/px · 14 of 72 slices shown]
[im 1/72]
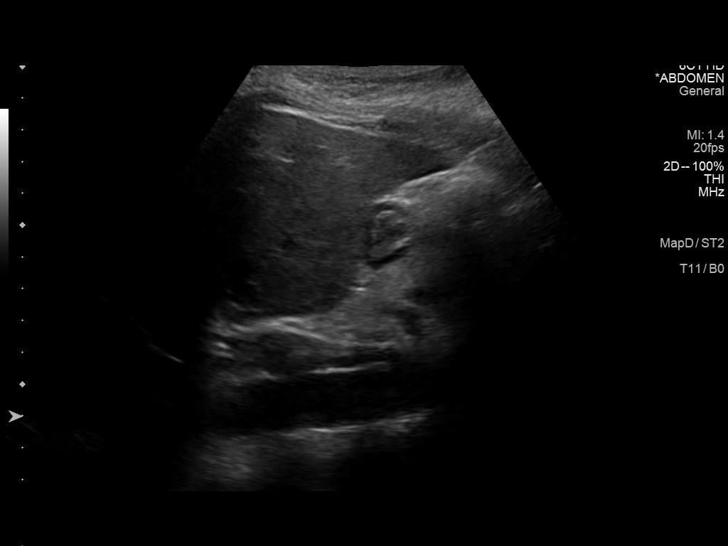
[im 6/72]
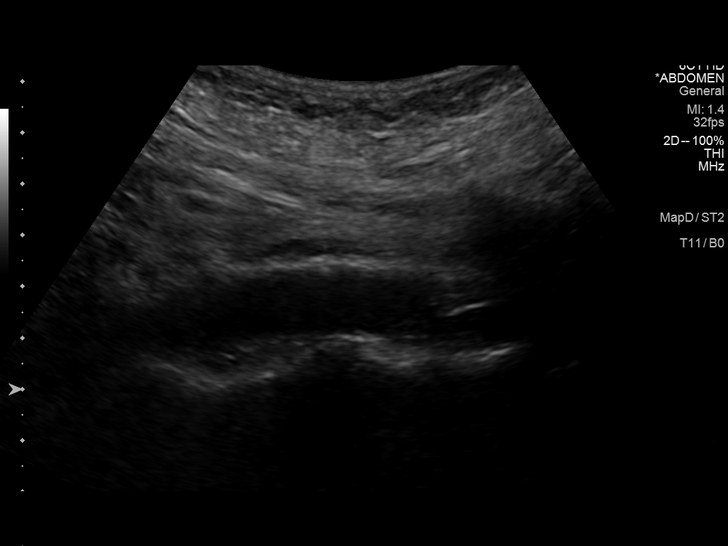
[im 12/72]
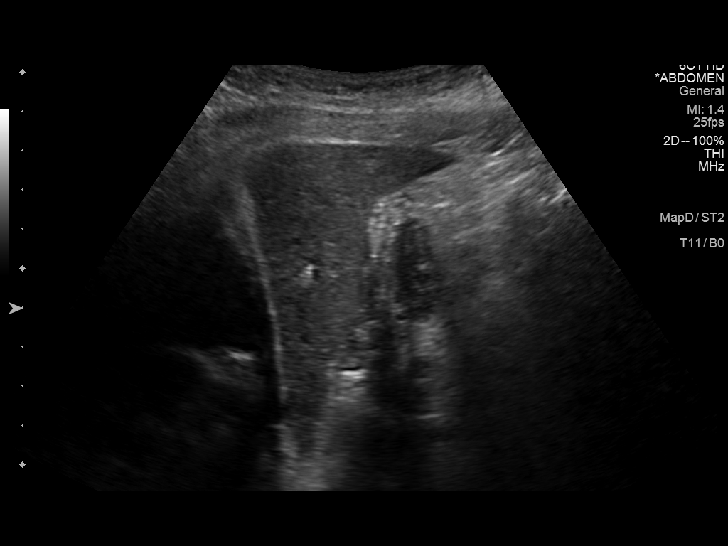
[im 18/72]
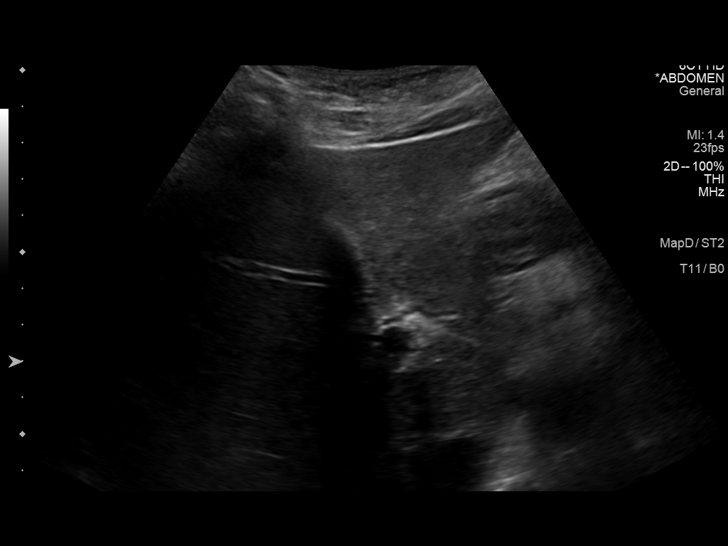
[im 24/72]
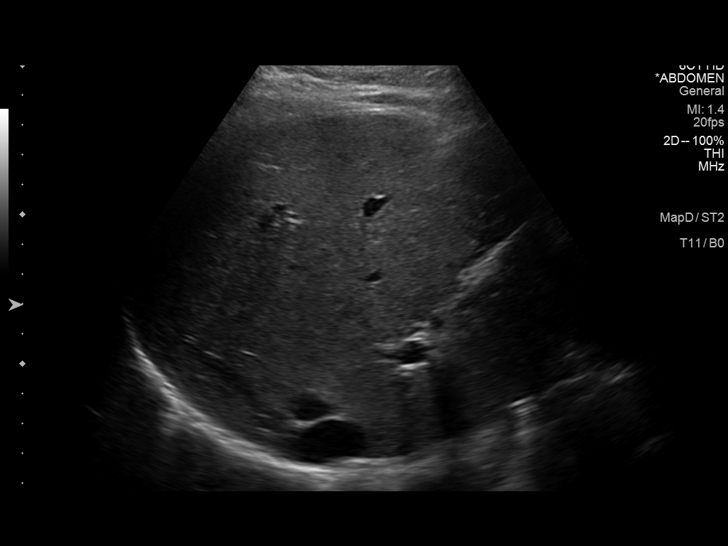
[im 27/72]
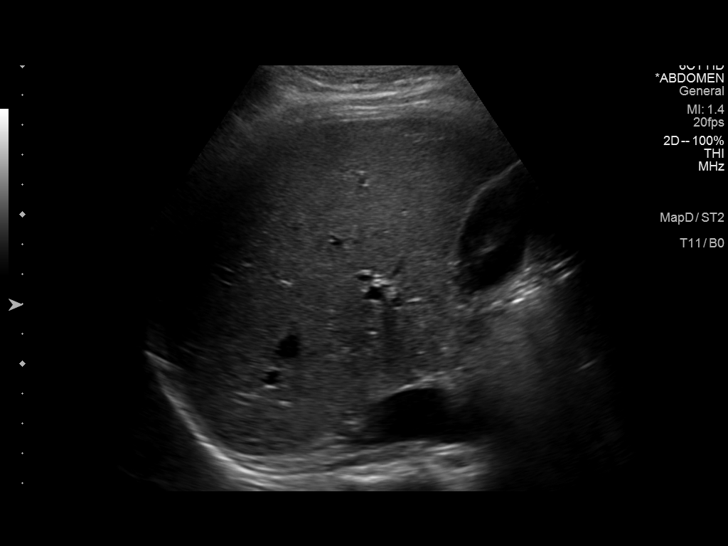
[im 33/72]
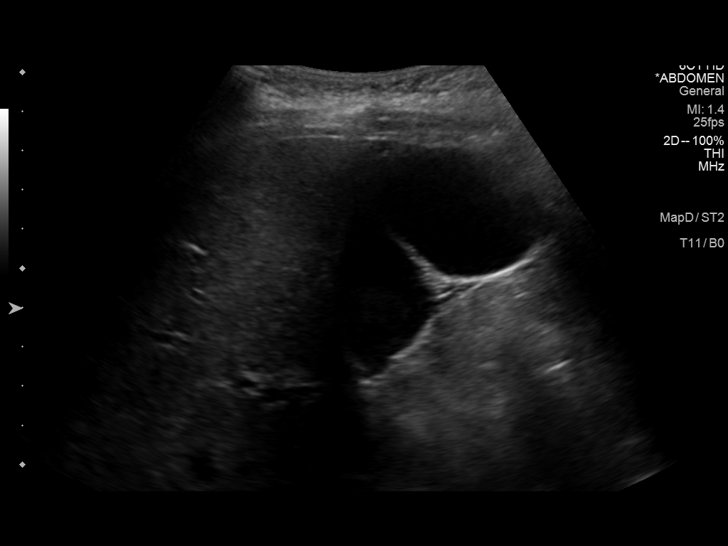
[im 39/72]
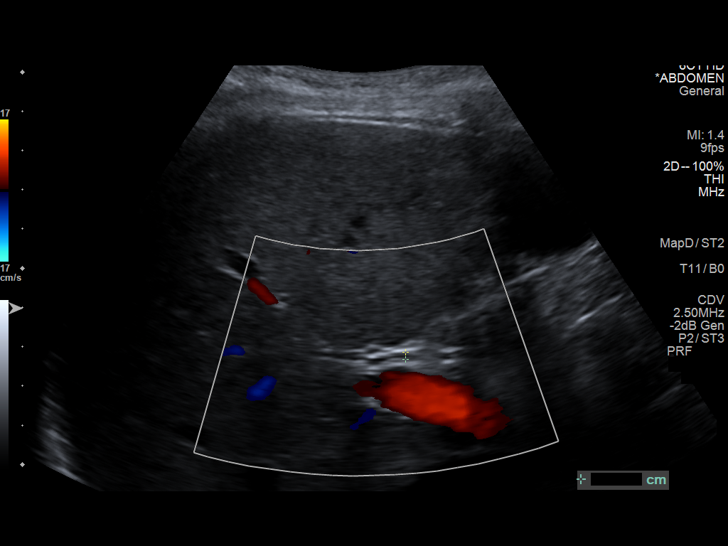
[im 45/72]
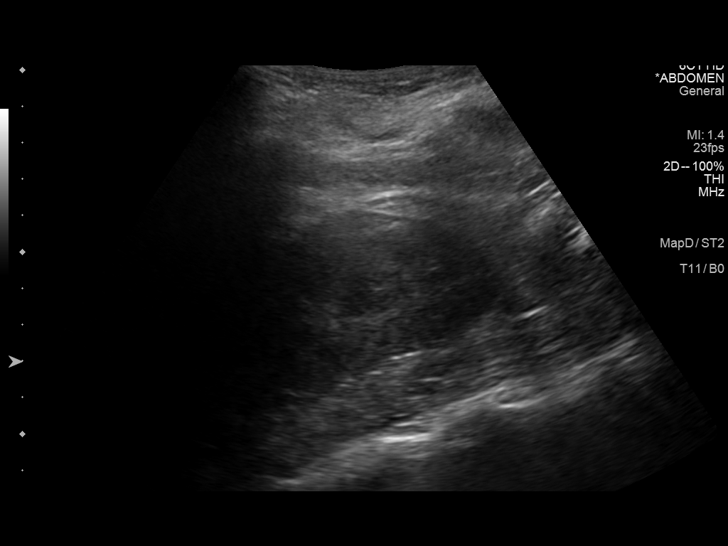
[im 48/72]
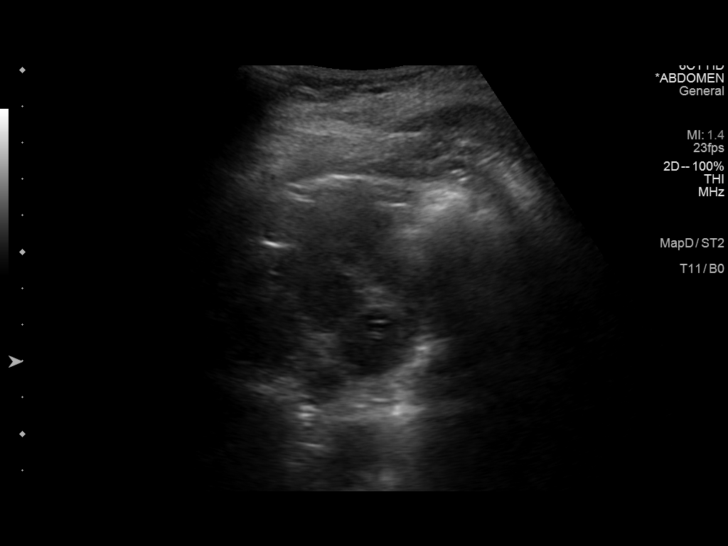
[im 54/72]
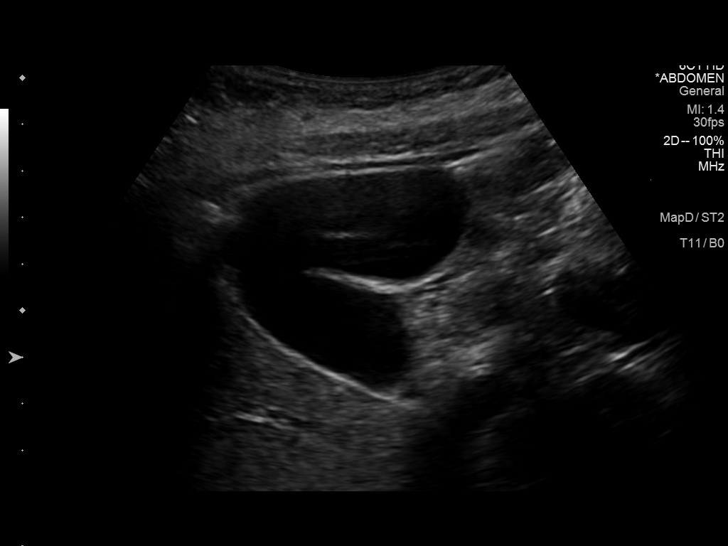
[im 60/72]
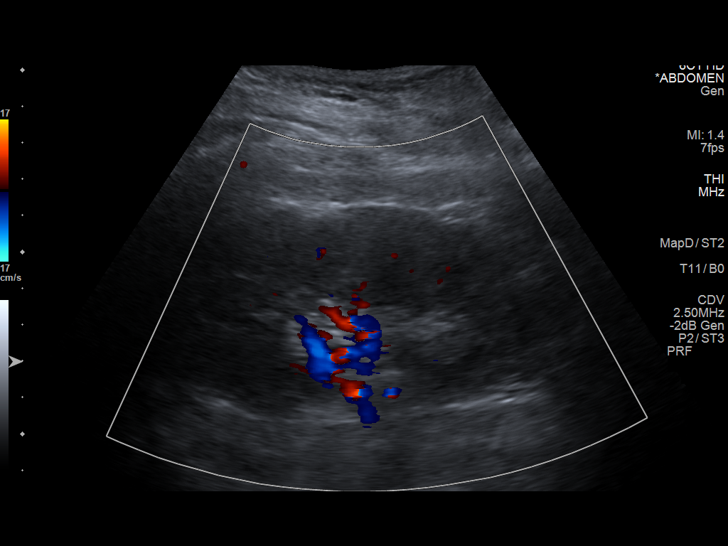
[im 66/72]
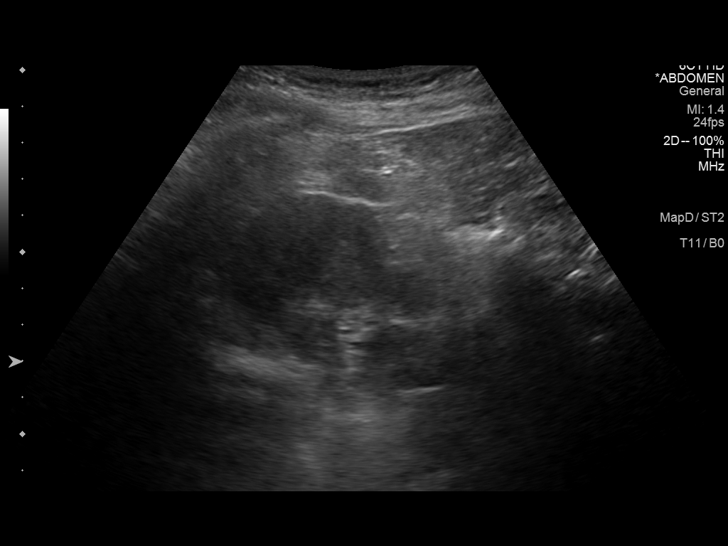
[im 72/72]
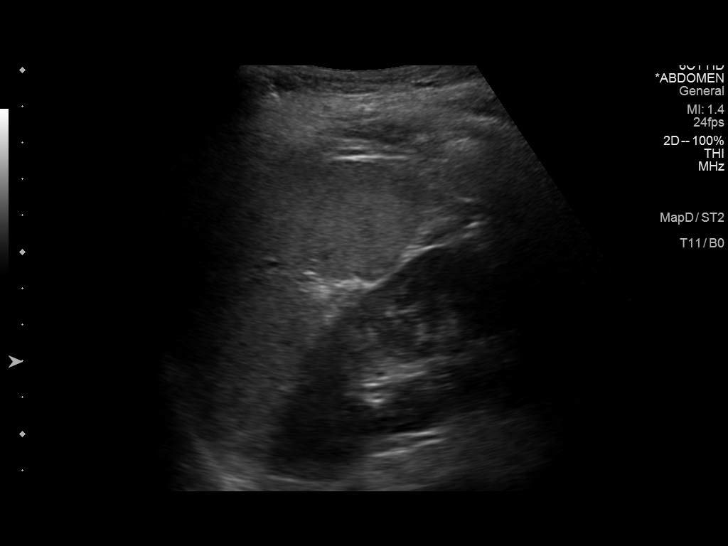

[14 of 25 positions shown; findings below may reference images not displayed]

FINDINGS: Gallbladder: No gallstones or wall thickening visualized. No
sonographic Murphy sign noted.

Common bile duct: Diameter: Normal, 2 mm.

Liver: No focal lesion identified. Within normal limits in
parenchymal echogenicity.

IVC: No abnormality visualized.

Pancreas: Poorly visualized due to overlying bowel gas.

Spleen: Size and appearance within normal limits.

Right Kidney: Length: 10.9 cm. Echogenicity within normal limits. No
mass or hydronephrosis visualized.

Left Kidney: Length: 11.6 cm. Echogenicity within normal limits. No
mass or hydronephrosis visualized.

Abdominal aorta: No aneurysm visualized.

Other findings: No ascites.

Exam mildly degraded by overlying bowel gas.
IMPRESSION: 1.  No acute abdominal process.
2. Mild degradation secondary to overlying bowel gas.

## 2015-05-13 IMAGING — CR DG ABDOMEN ACUTE W/ 1V CHEST
3 series · 3 of 3 positions shown · non-contrast
Comparison: Chest radiographs dated 05/06/2013.

CLINICAL DATA: Epigastric abdominal pain for the past 3 days.
Status post gastric bypass on 08/01/2013.

EXAM:
ACUTE ABDOMEN SERIES (ABDOMEN 2 VIEW & CHEST 1 VIEW)

[w chest pa]
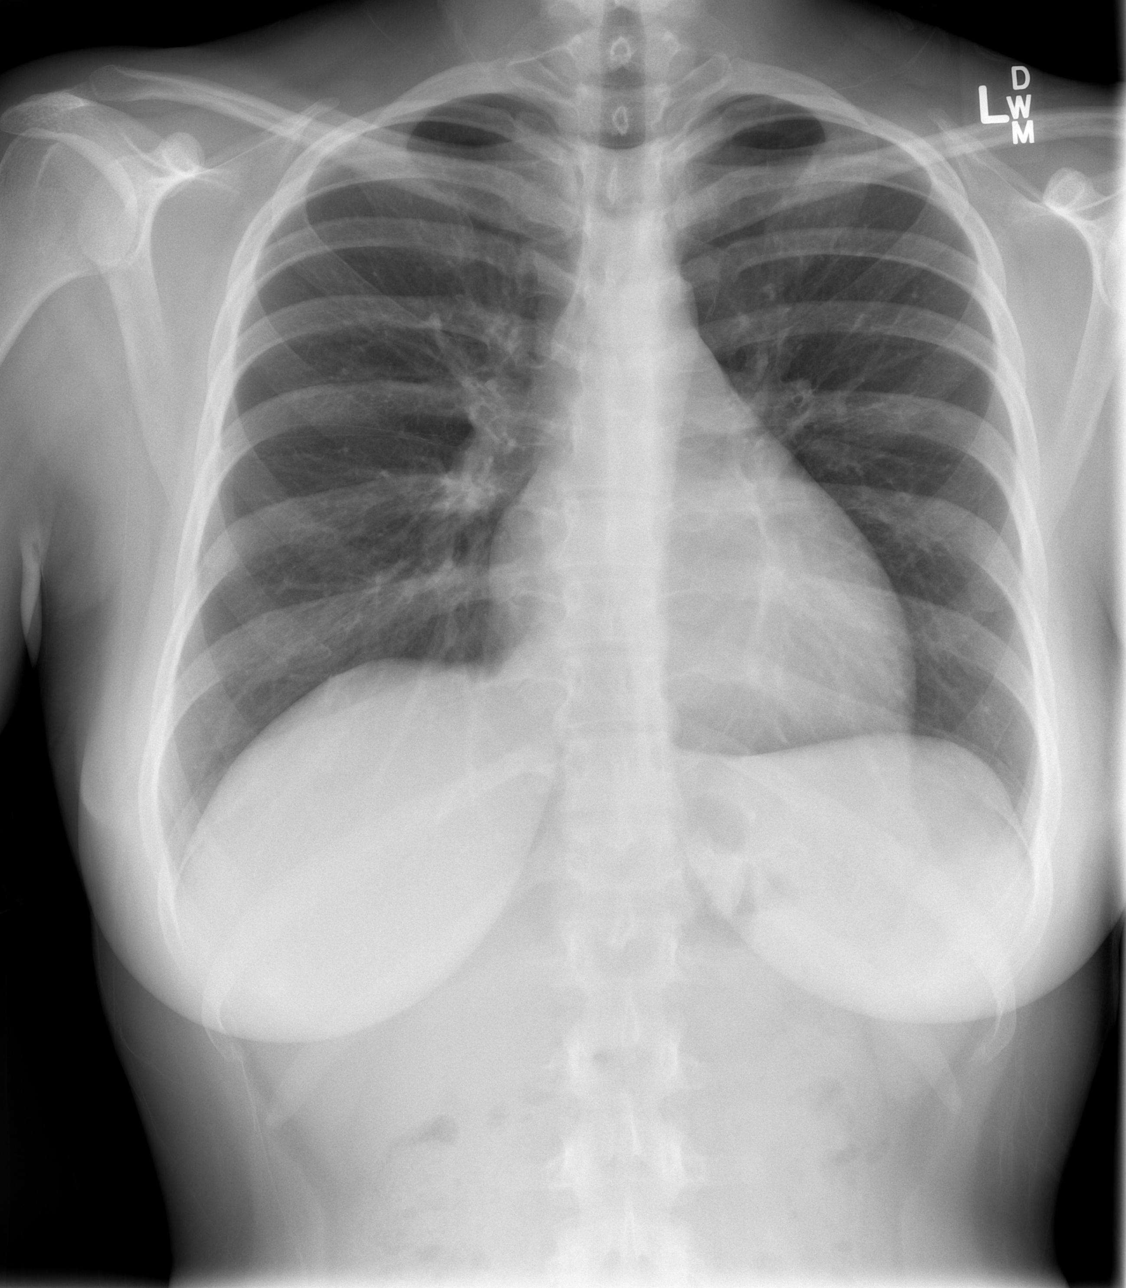

[w abdomen upright]
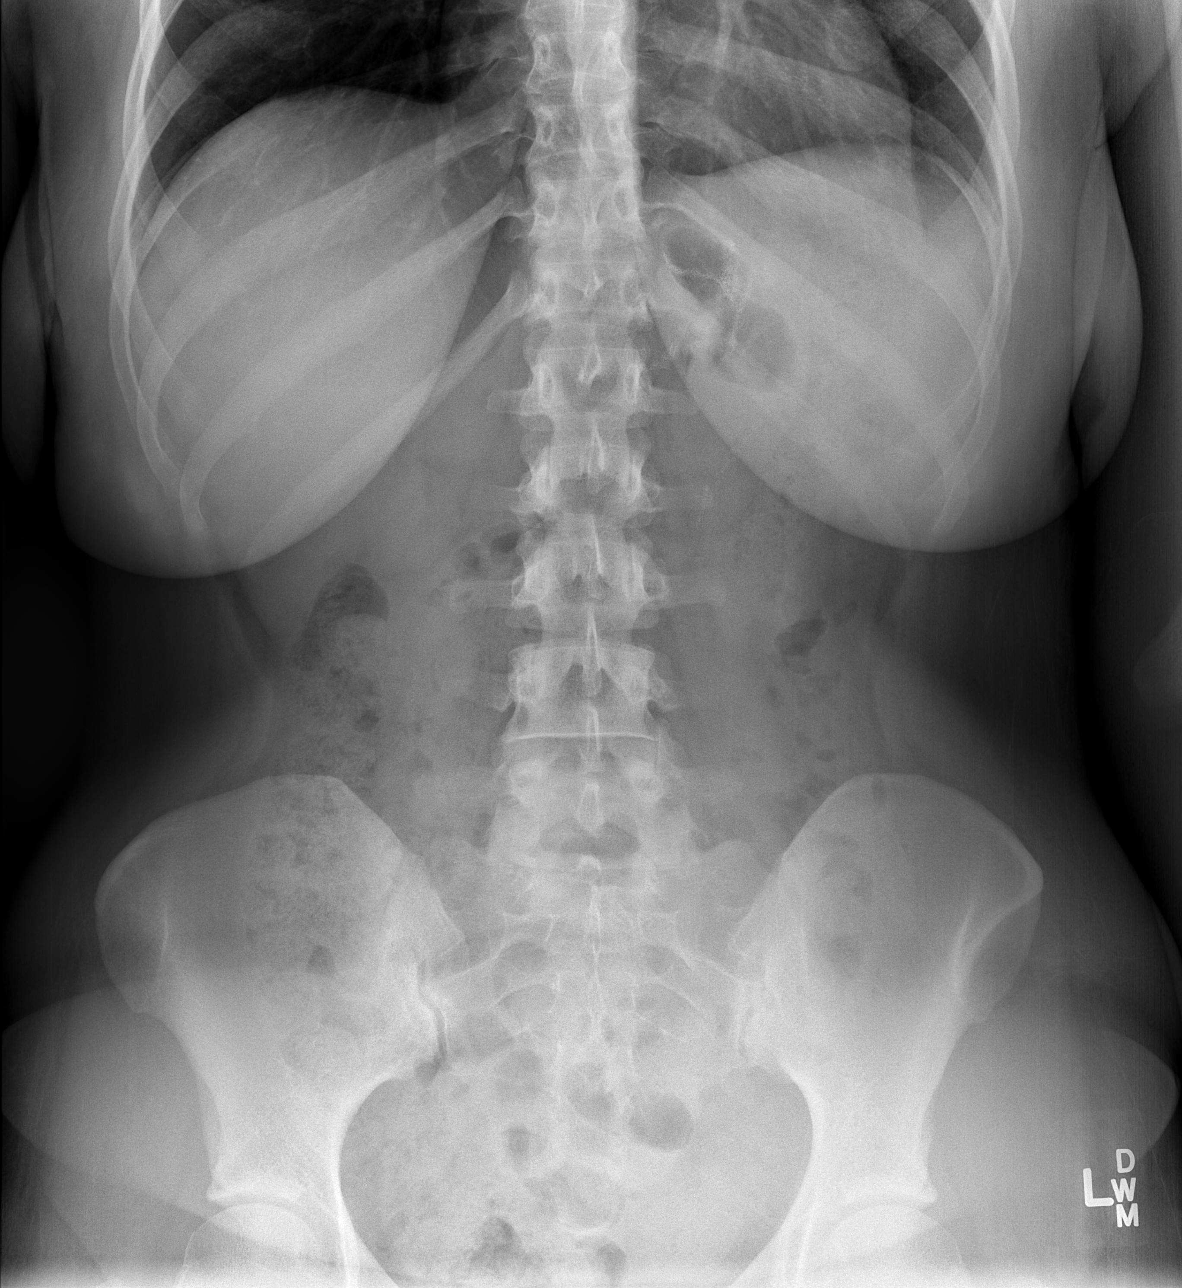

[t abdomen supine]
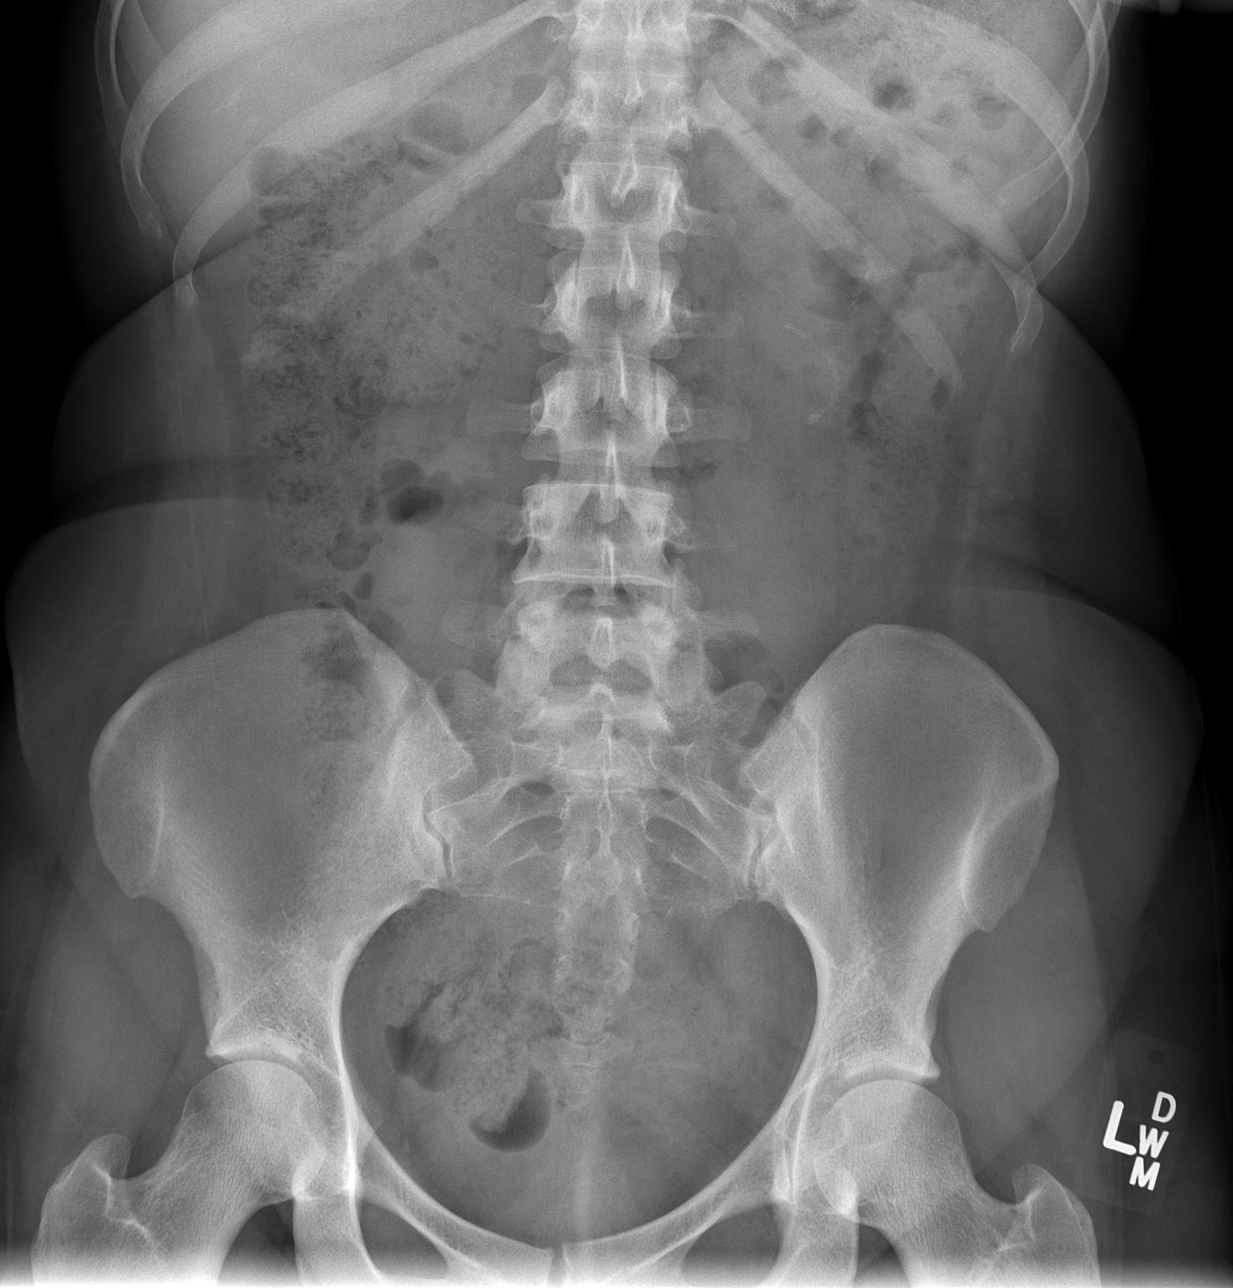

[3 of 3 positions shown; findings below may reference images not displayed]

FINDINGS: Normal sized heart. Clear lungs. Normal bowel gas pattern without
free peritoneal air. Surgical staples in the medial aspect of the
left upper abdomen. Unremarkable bones.
IMPRESSION: No acute abnormality.

## 2015-05-21 IMAGING — CT CT ABD-PELV W/ CM
1 of 2 series · 15 of 32 positions shown, 19 images · IV contrast (OMNIPAQUE 300)
Comparison: None.

CLINICAL DATA: 23-year-old with ongoing epigastric abdominal pain
since last [REDACTED] with nausea.

EXAM:
CT ABDOMEN AND PELVIS WITH CONTRAST
TECHNIQUE: Multidetector CT imaging of the abdomen and pelvis was performed
using the standard protocol following bolus administration of
intravenous contrast.
CONTRAST:  25mL OMNIPAQUE IOHEXOL 300 MG/ML SOLN, 80mL OMNIPAQUE
IOHEXOL 300 MG/ML SOLN

[Series 2: abd/pel with · axial · 0.65mm/px · z∈[-436,-31]mm · 15 of 89 slices shown, 19 images]
[im 4/89  soft-tissue]
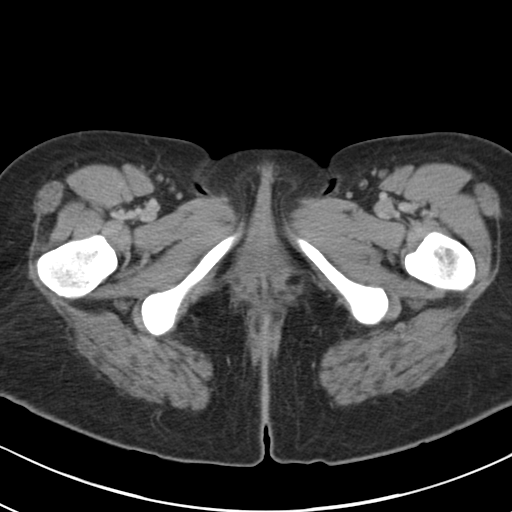
[im 4/89  bone]
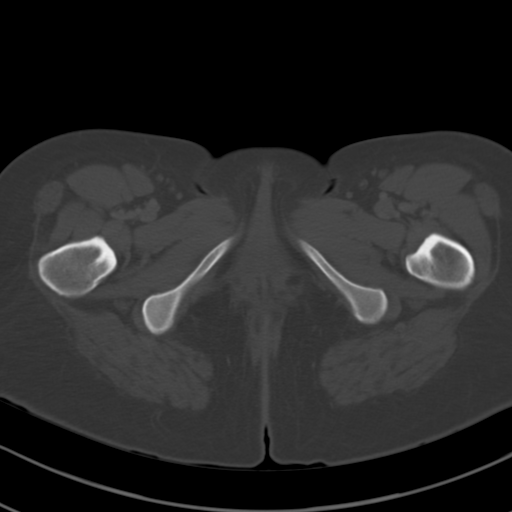
[im 12/89  soft-tissue]
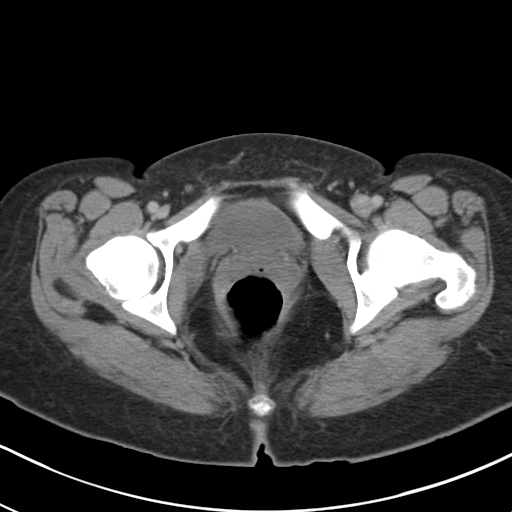
[im 19/89  soft-tissue]
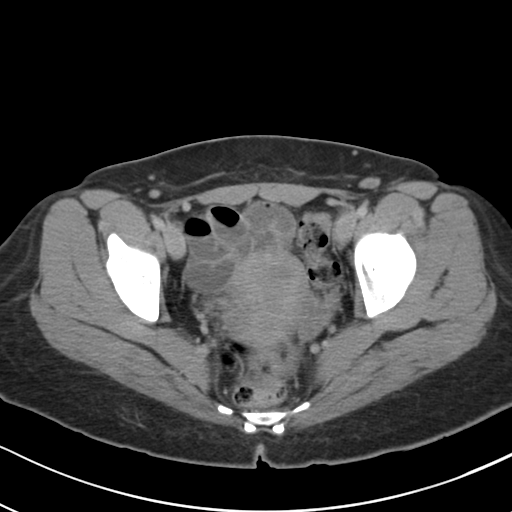
[im 26/89  soft-tissue]
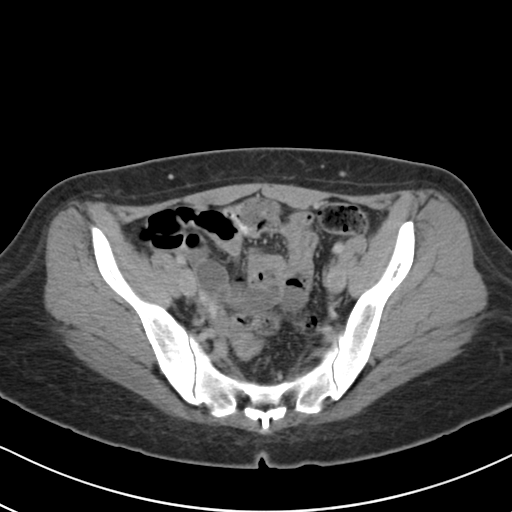
[im 30/89  soft-tissue]
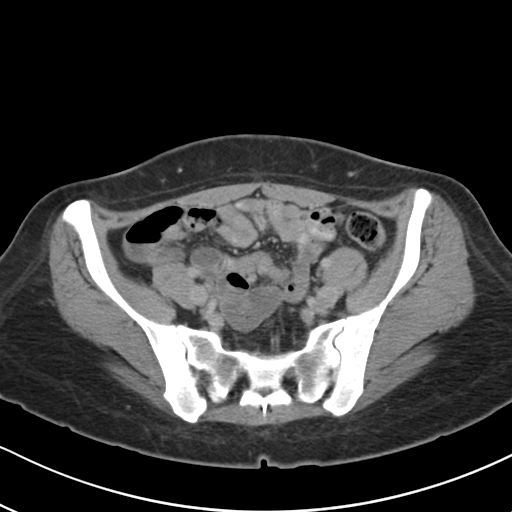
[im 37/89  soft-tissue]
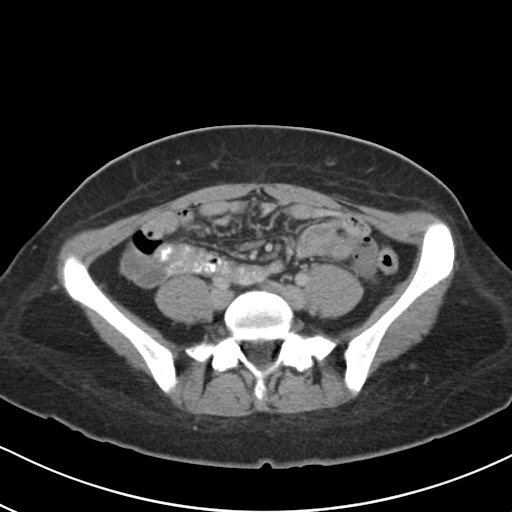
[im 45/89  soft-tissue]
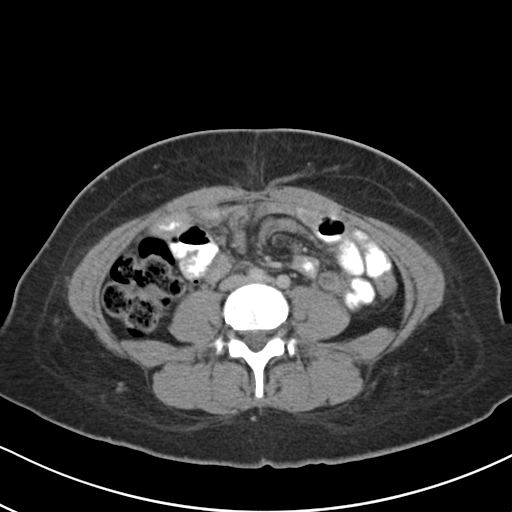
[im 52/89  soft-tissue]
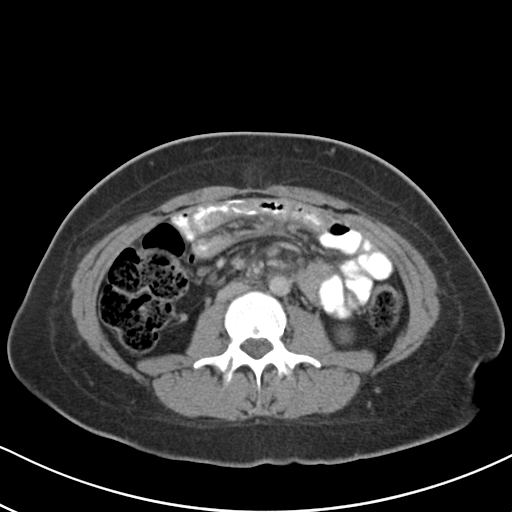
[im 59/89  soft-tissue]
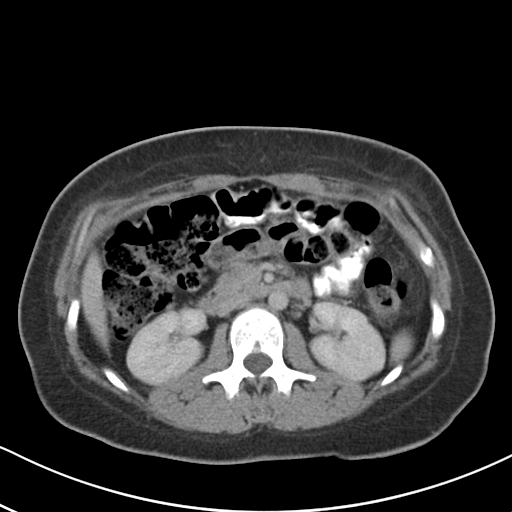
[im 59/89  bone]
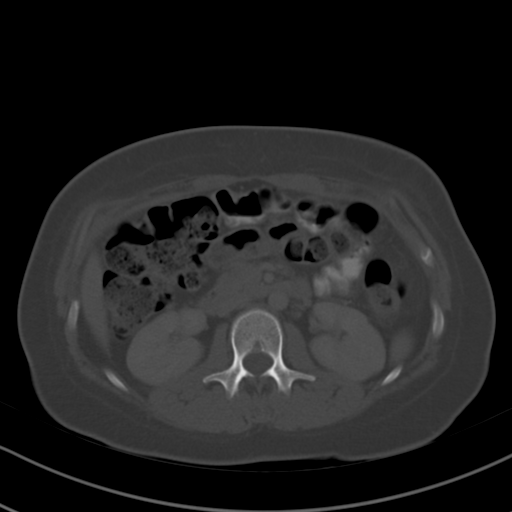
[im 63/89  soft-tissue]
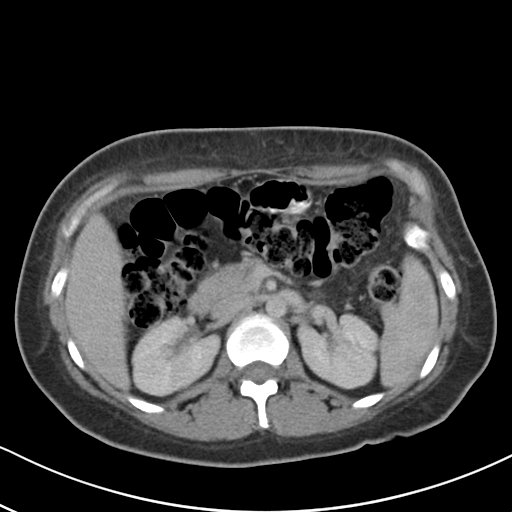
[im 70/89  soft-tissue]
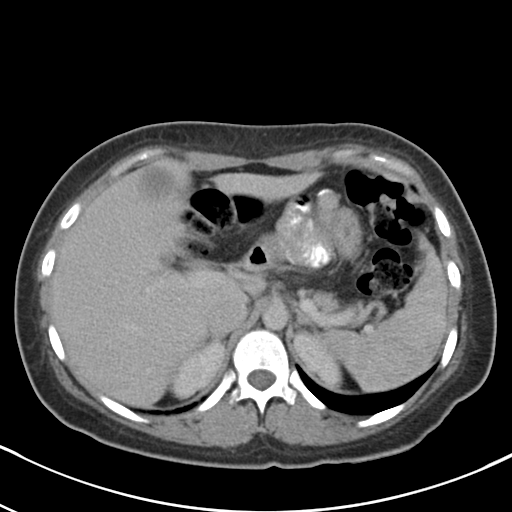
[im 74/89  lung]
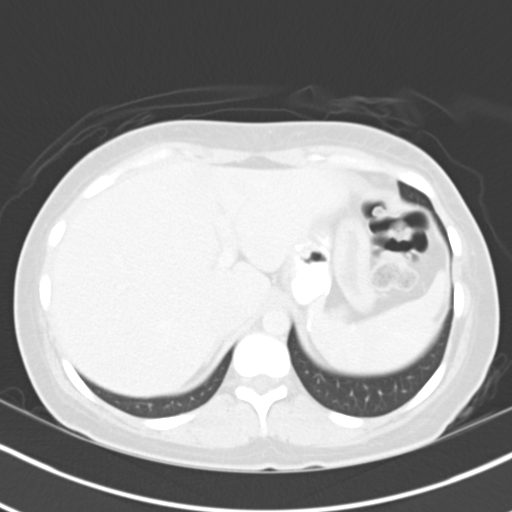
[im 78/89  soft-tissue]
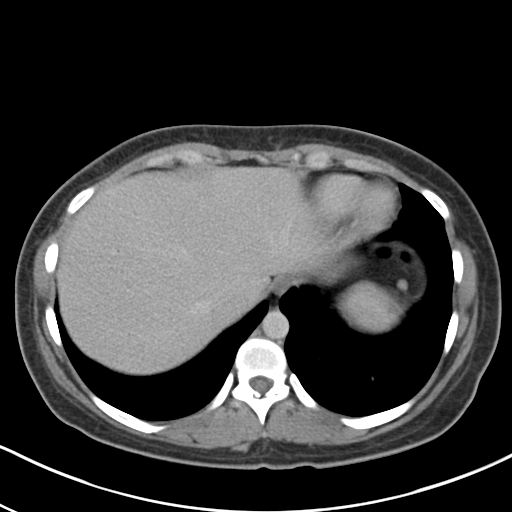
[im 78/89  lung]
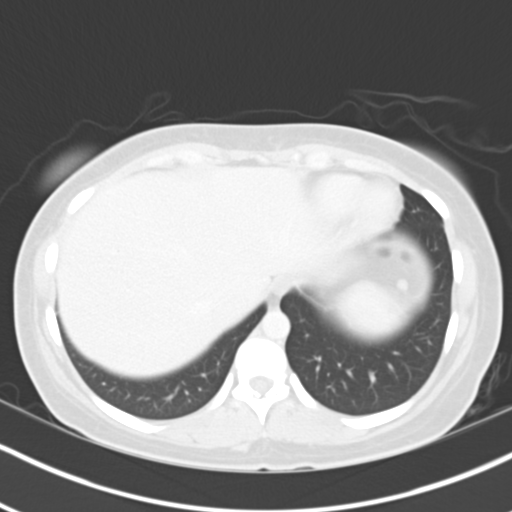
[im 81/89  lung]
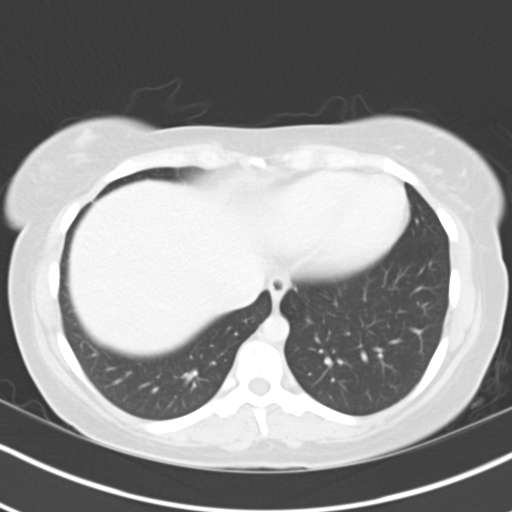
[im 85/89  soft-tissue]
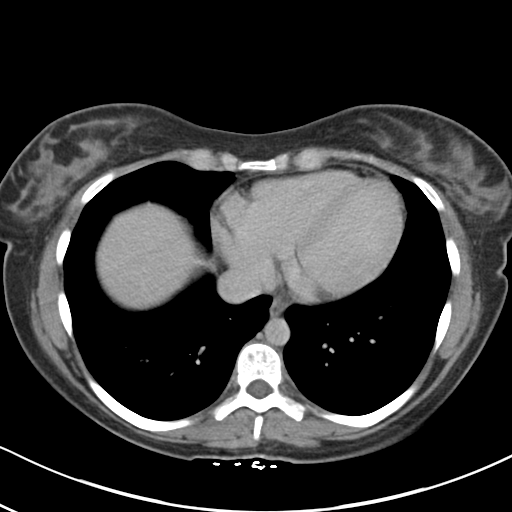
[im 85/89  lung]
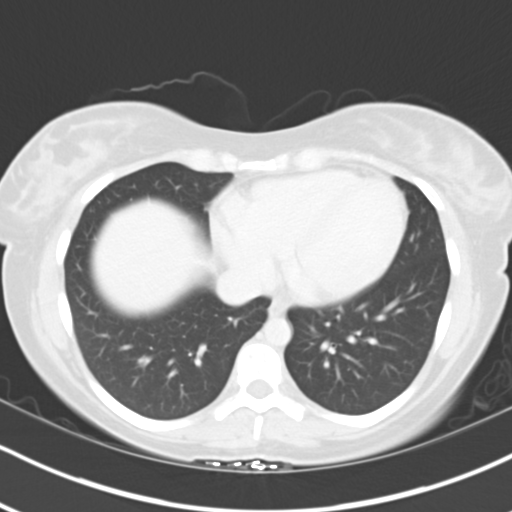

[15 of 32 positions shown; findings below may reference images not displayed]

FINDINGS: Lung bases are clear.  No evidence for free intraperitoneal air.

Postsurgical changes compatible with a gastric bypass procedure.
There is no evidence for an obstructive bowel process. No
significant bowel wall thickening.

Normal appearance of the liver, gallbladder and portal venous
system. Normal appearance of the spleen, pancreas, adrenal glands
and both kidneys. No gross abnormality to the uterus. Limited
evaluation of the ovaries and adnexal tissue. There is a prominent
follicle or involuting cyst in the right ovary measuring roughly
cm, sequence 2, image 67. Normal appearance of the appendix.

There is a small amount of free fluid in the pelvis along the
anterior aspect. There are prominent central mesenteric lymph nodes.
There appears to be mild mesenteric edema, best seen on sequence 2,
image 41. The configuration of the mesentery is irregular and likely
secondary to the abdominal surgery. Mild swirling of the central
abdominal mesentery without evidence for a bowel obstruction.

No acute bone abnormality.
IMPRESSION: Postsurgical changes from a gastric bypass procedure. There are
prominent mesenteric lymph nodes with mild mesenteric edema. There
is mild swirling of the mesentery which is likely related to the
surgery but no evidence for a bowel obstruction. Small amount of
free fluid in the pelvis. Small amount of fluid and mild mesenteric
edema could represent an inflammatory process within the abdomen.

## 2016-03-24 ENCOUNTER — Encounter: Payer: Self-pay | Admitting: Family Medicine

## 2017-05-23 ENCOUNTER — Inpatient Hospital Stay (HOSPITAL_COMMUNITY): Payer: BLUE CROSS/BLUE SHIELD

## 2017-05-23 ENCOUNTER — Encounter (HOSPITAL_COMMUNITY): Payer: Self-pay

## 2017-05-23 ENCOUNTER — Inpatient Hospital Stay (HOSPITAL_COMMUNITY)
Admission: AD | Admit: 2017-05-23 | Discharge: 2017-05-23 | Disposition: A | Discharge status: Home / Self Care | Payer: BLUE CROSS/BLUE SHIELD | Source: Ambulatory Visit | Attending: Obstetrics and Gynecology | Admitting: Obstetrics and Gynecology

## 2017-05-23 DIAGNOSIS — Z3A01 Less than 8 weeks gestation of pregnancy: Secondary | ICD-10-CM | POA: Diagnosis not present

## 2017-05-23 DIAGNOSIS — O99841 Bariatric surgery status complicating pregnancy, first trimester: Secondary | ICD-10-CM | POA: Insufficient documentation

## 2017-05-23 DIAGNOSIS — N939 Abnormal uterine and vaginal bleeding, unspecified: Secondary | ICD-10-CM | POA: Diagnosis present

## 2017-05-23 DIAGNOSIS — Z881 Allergy status to other antibiotic agents status: Secondary | ICD-10-CM | POA: Diagnosis not present

## 2017-05-23 DIAGNOSIS — O200 Threatened abortion: Secondary | ICD-10-CM | POA: Insufficient documentation

## 2017-05-23 DIAGNOSIS — Z888 Allergy status to other drugs, medicaments and biological substances status: Secondary | ICD-10-CM | POA: Diagnosis not present

## 2017-05-23 DIAGNOSIS — O209 Hemorrhage in early pregnancy, unspecified: Secondary | ICD-10-CM

## 2017-05-23 LAB — HCG, QUANTITATIVE, PREGNANCY: hCG, Beta Chain, Quant, S: 12457 m[IU]/mL — ABNORMAL HIGH (ref <5)

## 2017-05-23 NOTE — MAU Provider Note (Signed)
History     Chief Complaint  Patient presents with  . Vaginal Bleeding  27 yo G2P0010 MWF @ 6 w 6d gestation presents with c/o painless vaginal bleeding( BRBPV) at midnight. Pt is on Prometrium due to prior loss.Blood type A positive. Pt had hquant in office 2/27 which was 1480. She is scheduled for sonogram 3/13. Pt had brown vaginal spotting at 5 + week pregnant   OB History    Gravida Para Term Preterm AB Living   3 1     1  0   SAB TAB Ectopic Multiple Live Births   1       0      Past Medical History:  Diagnosis Date  . Epigastric pain 03-2014   Possible Internal Hernia  . Obesity   . Obesity     Past Surgical History:  Procedure Laterality Date  . 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 LAPAROSCOPIC INTERNAL HERNIA REPAIR N/A 04/12/2014   Procedure: REDUCTION OF INTERNAL HERNIA REPAIR;  Surgeon: Atilano InaEric M Wilson, MD;  Location: [facility];  Service: General;  Laterality: N/A;  . LAPAROSCOPIC LYSIS OF ADHESIONS  04/12/2014   Procedure: LAPAROSCOPIC LYSIS OF ADHESIONS;  Surgeon: Atilano InaEric M Wilson, MD;  Location: [facility];  Service: General;;  . LAPAROSCOPY N/A 04/12/2014   Procedure: LAPAROSCOPY DIAGNOSTIC;  Surgeon: Atilano InaEric M Wilson, MD;  Location: [facility];  Service: General;  Laterality: N/A;  . wisdom teeth extraction      Family History  Problem Relation Age of Onset  . Hyperlipidemia Mother   . Alcohol abuse Father   . Cancer Paternal Grandmother        lung cancer  . Obesity Other     Social History   Tobacco Use  . Smoking status: Never Smoker  . Smokeless tobacco: Never Used  Substance Use Topics  . Alcohol use: No  . Drug use: No    Allergies:  Allergies  Allergen Reactions  . Diphenhydramine Hcl     REACTION: urticaria (hives)  . Nitrofurantoin     REACTION: unspecified     Medications Prior to Admission  Medication Sig Dispense Refill Last Dose  . Multiple Vitamin (MULTIVITAMIN) tablet Take 1 tablet by mouth daily. Reported on 03/20/2015   Not Taking  . Norgestimate-Ethinyl Estradiol Triphasic 0.18/0.215/0.25 MG-25 MCG tab Take 1 tablet by mouth daily. 1 Package 11   . vitamin B-12 (CYANOCOBALAMIN) 100 MCG tablet Take 100 mcg by mouth daily. Reported on 03/20/2015   Not Taking     Physical Exam   Blood pressure 124/74, temperature 99.2 F (37.3 C), temperature source Oral, resp. rate 16, height 5' (1.524 m), weight 69.4 kg (153 lb), last menstrual period 04/05/2017.  General appearance: alert, cooperative and no distress Lungs: clear to auscultation bilaterally Heart: regular rate and rhythm, S1, S2 normal, no murmur, click, rub or gallop Abdomen: soft, non-tender; bowel sounds normal; no masses,  no organomegaly Pelvic: cervix normal in appearance, external genitalia normal, no adnexal masses or tenderness and cervix clsoed. dark blood mucus noted . no active bleeding. dark clots removed from vault, uterus sl enlarged AV, no adnexal mass Extremities: no edema, redness or tenderness in the calves or thighs ED Course  IMP: vaginal bleeding in pregnancy P) Hquant. Pelvic sonogram MDM  Addendum: [place] Ob Comp Less 14  Wks  Result Date: 05/23/2017 CLINICAL DATA:  Spotting. EXAM: OBSTETRIC <14 WK [place] AND TRANSVAGINAL OB US TECHNIQUE: Both transabdominal and transvaginal ultrasound examinations were performed for complete evaluation of the gestation as well as the maternal uterus, adnexal regions, and pelvic cul-de-sac. Transvaginal technique was performed to assess early pregnancy. COMPARISON:  None. FINDINGS: Intrauterine gestational sac: Single Yolk sac:  Visualized. Embryo:  Visualized. Cardiac Activity: Visualized. Heart Rate: 117 bpm CRL:  2.9 mm   5 w   5 d                  [place] EDC: 01/18/2018 Subchorionic hemorrhage: Moderate subchorionic hemorrhage identified,  measuring up to 2.6 cm. Maternal uterus/adnexae: Unremarkable. Right corpus luteum. Small free fluid in the pelvis. IMPRESSION: 1. Single, live intrauterine pregnancy with estimated gestational age of [redacted] weeks, 5 days. 2. Moderate subchorionic hemorrhage. Electronically Signed   By: Obie Dredge M.D.   On: 05/23/2017 08:49   US Ob Transvaginal  Result Date: 05/23/2017 CLINICAL DATA:  Spotting. EXAM: OBSTETRIC <14 WK [place] AND TRANSVAGINAL OB US TECHNIQUE: Both transabdominal and transvaginal ultrasound examinations were performed for complete evaluation of the gestation as well as the maternal uterus, adnexal regions, and pelvic cul-de-sac. Transvaginal technique was performed to assess early pregnancy. COMPARISON:  None. FINDINGS: Intrauterine gestational sac: Single Yolk sac:  Visualized. Embryo:  Visualized. Cardiac Activity: Visualized. Heart Rate: 117 bpm CRL:  2.9 mm   5 w   5 d                  [place] EDC: 01/18/2018 Subchorionic hemorrhage: Moderate subchorionic hemorrhage identified, measuring up to 2.6 cm. Maternal uterus/adnexae: Unremarkable. Right corpus luteum. Small free fluid in the pelvis. IMPRESSION: 1. Single, live intrauterine pregnancy with estimated gestational age of [redacted] weeks, 5 days. 2. Moderate subchorionic hemorrhage. Electronically Signed   By: Obie Dredge M.D.   On: 05/23/2017 08:49   Xavia Kniskern Cathie Beams, MD 7:54 AM 05/23/2017  Imp: threatened AB P) d/c home. F/u sonogram 2 week. edc 01/18/2018 based on this sono. Cont Prometrium. Call office  On Monday to r/s appt

## 2017-05-23 NOTE — MAU Note (Addendum)
Onset of bright red bleeding now brown during the night, denies cramping.

## 2017-05-23 NOTE — Discharge Instructions (Signed)
Threatened Miscarriage °A threatened miscarriage is when you have vaginal bleeding during your first 20 weeks of pregnancy but the pregnancy has not ended. Your doctor will do tests to make sure you are still pregnant. The cause of the bleeding may not be known. This condition does not mean your pregnancy will end. It does increase the risk of it ending (complete miscarriage). °Follow these instructions at home: °· Make sure you keep all your doctor visits for prenatal care. °· Get plenty of rest. °· Do not have sex or use tampons if you have vaginal bleeding. °· Do not douche. °· Do not smoke or use drugs. °· Do not drink alcohol. °· Avoid caffeine. °Contact a doctor if: °· You have light bleeding from your vagina. °· You have belly pain or cramping. °· You have a fever. °Get help right away if: °· You have heavy bleeding from your vagina. °· You have clots of blood coming from your vagina. °· You have bad pain or cramps in your low back or belly. °· You have fever, chills, and bad belly pain. °This information is not intended to replace advice given to you by your health care provider. Make sure you discuss any questions you have with your health care provider. °Document Released: 02/14/2008 Document Revised: 08/09/2015 Document Reviewed: 12/28/2012 °Elsevier Interactive Patient Education © 2018 Elsevier Inc. ° °

## 2017-06-10 ENCOUNTER — Ambulatory Visit (INDEPENDENT_AMBULATORY_CARE_PROVIDER_SITE_OTHER): Payer: BLUE CROSS/BLUE SHIELD | Admitting: Family Medicine

## 2017-06-10 ENCOUNTER — Other Ambulatory Visit: Payer: Self-pay | Admitting: Family Medicine

## 2017-06-10 ENCOUNTER — Encounter: Payer: Self-pay | Admitting: Family Medicine

## 2017-06-10 VITALS — BP 118/78 | Ht 64.0 in | Wt 156.6 lb

## 2017-06-10 DIAGNOSIS — Z13220 Encounter for screening for lipoid disorders: Secondary | ICD-10-CM

## 2017-06-10 DIAGNOSIS — K2100 Gastro-esophageal reflux disease with esophagitis, without bleeding: Secondary | ICD-10-CM

## 2017-06-10 NOTE — Progress Notes (Signed)
   Subjective:    Patient ID: Karen LadeAmber M Zakrzewski, female    DOB: 01/13/1991, 27 y.o.   MRN: [id number]  HPI  Patient arrives to get established as a patient. Patient states she needs follow up blood work due to her gastric bypass in 07/2013. Patient also reports she is [redacted] weeks pregnant.    b w to do soon    pt exercises reg generally, slacked off this winter   now under advisement no to push it   Reflux like issues , at times,   Adjusts diet according ly    [facility]  [place]  Review of Systems No headache, no major weight loss or weight gain, no chest pain no back pain abdominal pain no change in bowel habits complete ROS otherwise negative     Objective:   Physical Exam Alert vitals stable, NAD. Blood pressure good on repeat. HEENT normal. Lungs clear. Heart regular rate and rhythm.       Assessment & Plan:  Impression new patient.  History of gastric bypass.  Due to have blood work shortly.  Lipid profile not ordered we will add this.  Also, intermittent reflux.  Perhaps once or twice per week.  Aggravated by certain foods.  Now [redacted] weeks pregnant.  Followed by OB/GYN in this regard.  Symptom care for reflux discussed questions answered follow-up as needed

## 2017-06-11 LAB — LIPID PANEL W/O CHOL/HDL RATIO
CHOLESTEROL TOTAL: 200 mg/dL — AB (ref 100–199)
HDL: 84 mg/dL (ref >39)
LDL CALC: 105 mg/dL — AB (ref 0–99)
Triglycerides: 53 mg/dL (ref 0–149)
VLDL Cholesterol Cal: 11 mg/dL (ref 5–40)

## 2017-06-11 LAB — SPECIMEN STATUS REPORT

## 2017-06-21 ENCOUNTER — Encounter: Payer: Self-pay | Admitting: Family Medicine

## 2017-07-08 ENCOUNTER — Ambulatory Visit: Payer: BLUE CROSS/BLUE SHIELD | Admitting: Skilled Nursing Facility1

## 2017-07-09 ENCOUNTER — Ambulatory Visit: Payer: BLUE CROSS/BLUE SHIELD | Admitting: Skilled Nursing Facility1

## 2017-07-16 ENCOUNTER — Encounter: Payer: Self-pay | Admitting: Skilled Nursing Facility1

## 2017-07-16 ENCOUNTER — Encounter: Payer: BLUE CROSS/BLUE SHIELD | Attending: General Surgery | Admitting: Skilled Nursing Facility1

## 2017-07-16 DIAGNOSIS — Z713 Dietary counseling and surveillance: Secondary | ICD-10-CM | POA: Insufficient documentation

## 2017-07-16 DIAGNOSIS — Z9884 Bariatric surgery status: Secondary | ICD-10-CM | POA: Insufficient documentation

## 2017-07-16 DIAGNOSIS — Z6827 Body mass index (BMI) 27.0-27.9, adult: Secondary | ICD-10-CM | POA: Diagnosis not present

## 2017-07-16 DIAGNOSIS — E663 Overweight: Secondary | ICD-10-CM

## 2017-07-16 NOTE — Progress Notes (Signed)
Post-Operative RYGB Surgery  Primary concerns today: Post-operative Bariatric Surgery Nutrition Management.  RYGB 2015. Pt states she has recently Miscarried. Pt states she is Stressed that she has weight gain. Pt reports Feeling shaky and seeing spots 2 occurences last month but nothing recently randomly starting last time was driving feeling like she is going to black out one year ago with racing heart with anxiety having Just gotten off of work possibly being about 4-5 hours since eating. Pt states she is struggling with infertility stating her doctors tell her that does not make any since because you have lost 100 pounds. Pt states she needs to work on emotional eating.  Pts Surgeon states she had some glucose in her urine in the referral.  Fasting blood sugar 85 Meter given: contour next Lot#: [id number]   Exp: 08/15/2018  24-hr recall: No Time B (AM):  Snk (AM):  L (PM):  Snk (PM):  D (PM): Snk (PM):  Fluid intake:  Estimated total protein intake:   Medications: N/A Supplementation: b12 and multivitamin and calcium  Using straws: no Drinking while eating: no Having you been chewing well:no Chewing/swallowing difficulties: no Changes in vision: no Changes to mood/headaches: no Hair loss/Cahnges to skin/Changes to nails: no Any difficulty focusing or concentrating: no Sweating: no Dizziness/Lightheaded:  Palpitations: no  Carbonated beverages: no N/V/D/C/GAS: no time Abdominal Pain: no Dumping syndrome: unclear  Recent physical activity:  walking  Progress Towards Goal(s):  In progress.  Handouts given during visit include:  Needs  emotions  Should I eat    Intervention:  Nutrition cousneling. Dietitian educated pt on emotional eating and glucose monitoring. Goals: Check your blood sugar fasting, 2 hours after eating, if having symptoms Identify when you are eating due to appetite and what you are feeling Carbohydrates are not bad foods  Teaching Method  Utilized:  Visual Auditory Hands on  Barriers to learning/adherence to lifestyle change: unknown at this time  Demonstrated degree of understanding via:  Teach Back   Monitoring/Evaluation:  Dietary intake, exercise, and body weight.

## 2017-08-17 ENCOUNTER — Encounter: Payer: BLUE CROSS/BLUE SHIELD | Attending: General Surgery | Admitting: Skilled Nursing Facility1

## 2017-08-17 ENCOUNTER — Encounter: Payer: Self-pay | Admitting: Skilled Nursing Facility1

## 2017-08-17 DIAGNOSIS — Z9884 Bariatric surgery status: Secondary | ICD-10-CM | POA: Insufficient documentation

## 2017-08-17 DIAGNOSIS — Z713 Dietary counseling and surveillance: Secondary | ICD-10-CM | POA: Diagnosis not present

## 2017-08-17 DIAGNOSIS — Z6827 Body mass index (BMI) 27.0-27.9, adult: Secondary | ICD-10-CM | POA: Insufficient documentation

## 2017-08-17 DIAGNOSIS — E669 Obesity, unspecified: Secondary | ICD-10-CM

## 2017-08-17 NOTE — Patient Instructions (Addendum)
-  Weight bearing 2-3 days a week and cardio 5-6 days a week aiming for at least 45 minutes of sweaty and out of breath   -Try crushing up your multi in yogurt  -Do not take more than 1-2 pills at once  -Take your b12 once every 3 days or give to your mom  -Take calcium at least 2 times a day 2 hours from one another and 2 hours from your multivitamin   -Eat every 3 hours: for snacks either just protein or protein and carbohydrates

## 2017-08-17 NOTE — Progress Notes (Signed)
Post-Operative RYGB Surgery  Primary concerns today: Post-operative Bariatric Surgery Nutrition Management.  RYGB 2015. Pts main gaol is fertility.  Pt states she is back taking fertility medication. Pt states sugar affects her physically making her sick. Pt states she was not consistent with taking her blood sugar levels. Fasting: 90, 97   2 hours after eating: 161, 106, 108 with one reading of 56 at 9pm without eating. Pt states she chokes on her pills if she takes a handful at once.   24-hr recall:  B (AM): egg white veggie omelet  Snk (AM): bolied eggs L (PM): whole grain flat bread with heated chicken and veggies with sugar free BBQ sauce or frozen meals  Snk (PM): rice cake with cream cheese and fruit D (PM): chicken or deer meat with tacos or spaghetti with vegetable Snk (PM): slim fast protein shake  Fluid intake: 4-5 bottles water, diet soda Estimated total protein intake: 60+  Medications: N/A Supplementation: b12 and celebrate chewable multivitamin and calcium and baby aspirin and prenatal vitamin   Using straws: no Drinking while eating: no Having you been chewing well:no Chewing/swallowing difficulties: no Changes in vision: no Changes to mood/headaches: no Hair loss/Cahnges to skin/Changes to nails: no Any difficulty focusing or concentrating: no Sweating: no Dizziness/Lightheaded:  Palpitations: no  Carbonated beverages: no N/V/D/C/GAS: no time Abdominal Pain: no Dumping syndrome: unclear (seems to be more related to reactive hypoglycemia)  Recent physical activity:  Walking 3 miles 5-6 days with jogging, some hand weights   Progress Towards Goal(s):  In progress.  Handouts given during visit include:  Needs  emotions  Should I eat    Intervention:  Nutrition cousneling. Dietitian educated pt on emotional eating and glucose monitoring. Goals: Check your blood sugar fasting, 2 hours after eating, if having symptoms Identify when you are eating due to  appetite and what you are feeling Carbohydrates are not bad foods -Weight bearing 2-3 days a week and cardio 5-6 days a week aiming for at least 45 minutes of sweaty and out of breath  -Try crushing up your multi in yogurt -Do not take more than 1-2 pills at once -Take your b12 once every 3 days or give to your mom -Take calcium at least 2 times a day 2 hours from one another and 2 hours from your multivitamin   -Eat every 3 hours: for snacks either just protein or protein and carbohydrates   Teaching Method Utilized:  Visual Auditory Hands on  Barriers to learning/adherence to lifestyle change: unknown at this time  Demonstrated degree of understanding via:  Teach Back   Monitoring/Evaluation:  Dietary intake, exercise, and body weight.

## 2018-03-17 NOTE — L&D Delivery Note (Addendum)
Delivery Note At 3:55 PM a viable and healthy female was delivered via Vaginal, Spontaneous (Presentation: OP ).  APGAR: 9,9 ; weight  pending.   Placenta status: spontaneous, complete, .  Cord:  with the following complications:none.  Cord pH: N/A  Anesthesia:  epidural Episiotomy: None Lacerations: 2nd degree Suture Repair: 3.0 vicryl rapide Est. Blood Loss (mL):  300   Cytotec 1000 mcg placed rectally due to trickle of fresh bleeding, uterus explored- no clots or membranes or POCs noted  Mom to postpartum.  Baby to Couplet care / Skin to Skin.  Elveria Royals 10/26/2018, 4:22 PM

## 2018-04-04 ENCOUNTER — Encounter (HOSPITAL_COMMUNITY): Payer: Self-pay

## 2018-09-09 ENCOUNTER — Encounter (HOSPITAL_COMMUNITY): Payer: Self-pay

## 2018-09-09 ENCOUNTER — Inpatient Hospital Stay (HOSPITAL_COMMUNITY)
Admission: AD | Admit: 2018-09-09 | Discharge: 2018-09-09 | Disposition: A | Discharge status: Home / Self Care | Payer: BC Managed Care – PPO | Attending: Obstetrics and Gynecology | Admitting: Obstetrics and Gynecology

## 2018-09-09 ENCOUNTER — Other Ambulatory Visit: Payer: Self-pay

## 2018-09-09 ENCOUNTER — Inpatient Hospital Stay (HOSPITAL_BASED_OUTPATIENT_CLINIC_OR_DEPARTMENT_OTHER): Payer: BC Managed Care – PPO

## 2018-09-09 DIAGNOSIS — Z3A31 31 weeks gestation of pregnancy: Secondary | ICD-10-CM | POA: Insufficient documentation

## 2018-09-09 DIAGNOSIS — Z9884 Bariatric surgery status: Secondary | ICD-10-CM | POA: Insufficient documentation

## 2018-09-09 DIAGNOSIS — O4400 Placenta previa specified as without hemorrhage, unspecified trimester: Secondary | ICD-10-CM | POA: Diagnosis not present

## 2018-09-09 DIAGNOSIS — O4403 Placenta previa specified as without hemorrhage, third trimester: Secondary | ICD-10-CM

## 2018-09-09 DIAGNOSIS — O4693 Antepartum hemorrhage, unspecified, third trimester: Secondary | ICD-10-CM | POA: Diagnosis present

## 2018-09-09 NOTE — MAU Provider Note (Signed)
Chief Complaint:  Vaginal Bleeding   First Provider Initiated Contact with Patient 09/09/18 2115      HPI: Karen Mcintyre is a 28 y.o. G4P0010 at [id number] presents to maternity admissions reporting bleeding this afternoon.  It did happen after a bowel movement, small amount of red blood on tissue.  No further bleeding, did not require a pad.  Does not feel any contractions.  Does have a placenta previa identified earlier in pregnancy.  Is scheduled to have a followup US next week.  . She reports good fetal movement, denies LOF, vaginal itching/burning, urinary symptoms, h/a, dizziness, n/v, diarrhea, constipation or fever/chills.  She denies headache, visual changes or RUQ abdominal pain  Past Medical History: Past Medical History:  Diagnosis Date  . Epigastric pain 03-2014   Possible Internal Hernia  . Obesity   . Obesity     Past obstetric history: OB History  Gravida Para Term Preterm AB Living  4 1     1  0  SAB TAB Ectopic Multiple Live Births  1       0    # Outcome Date GA Lbr Len/2nd Weight Sex Delivery Anes PTL Lv  4 Current           3 SAB           2 Para           1 Gravida             Past Surgical History: Past Surgical History:  Procedure Laterality Date  . 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 LAPAROSCOPIC INTERNAL HERNIA REPAIR N/A 04/12/2014   Procedure: REDUCTION OF INTERNAL HERNIA REPAIR;  Surgeon: Atilano InaEric M Wilson, MD;  Location: [facility];  Service: General;  Laterality: N/A;  . LAPAROSCOPIC LYSIS OF ADHESIONS  04/12/2014   Procedure: LAPAROSCOPIC LYSIS OF ADHESIONS;  Surgeon: Atilano InaEric M Wilson, MD;  Location: [facility];  Service: General;;  . LAPAROSCOPY N/A 04/12/2014   Procedure: LAPAROSCOPY DIAGNOSTIC;  Surgeon: Atilano InaEric M Wilson, MD;  Location: [facility];  Service: General;   Laterality: N/A;  . wisdom teeth extraction      Family History: Family History  Problem Relation Age of Onset  . Hyperlipidemia Mother   . Alcohol abuse Father   . Cancer Paternal Grandmother        lung cancer  . Obesity Other     Social History: Social History   Tobacco Use  . Smoking status: Never Smoker  . Smokeless tobacco: Never Used  Substance Use Topics  . Alcohol use: No  . Drug use: No    Allergies:  Allergies  Allergen Reactions  . Diphenhydramine Hcl     REACTION: urticaria (hives)  . Nitrofurantoin     REACTION: unspecified    Meds:  Medications Prior to Admission  Medication Sig Dispense Refill Last Dose  . Prenatal Vit-Fe Fumarate-FA (PRENATAL MULTIVITAMIN) TABS tablet Take 1 tablet by mouth daily at 12 noon.     . progesterone (PROMETRIUM) 100 MG capsule Take 100 mg by mouth daily.  1   . vitamin B-12 (CYANOCOBALAMIN) 100 MCG tablet Take 100 mcg by mouth daily. Reported on 03/20/2015       I have reviewed patient's Past Medical Hx, Surgical Hx, Family Hx, Social Hx, medications and allergies.   ROS:  Review of Systems  Constitutional: Negative for chills and fever.  Respiratory: Negative for shortness of breath.   Gastrointestinal: Negative for abdominal pain, constipation, diarrhea and nausea.  Genitourinary: Positive for vaginal bleeding. Negative for pelvic pain and vaginal discharge.  Musculoskeletal: Negative for back pain.   Other systems negative  Physical Exam   Patient Vitals for the past 24 hrs:  Height Weight  09/09/18 2112 5\' 3"  (1.6 m) 89 kg   Constitutional: Well-developed, well-nourished female in no acute distress.  Cardiovascular: normal rate and rhythm Respiratory: normal effort, clear to auscultation bilaterally GI: Abd soft, non-tender, gravid appropriate for gestational age.   No rebound or guarding.  No visible hemorrhoids. MS: Extremities nontender, no edema, normal ROM Neurologic: Alert and oriented x 4.  GU: Neg  CVAT.  PELVIC EXAM: Cervix pink, visually closed, without lesion, scant Dark brown mucous discharge, vaginal walls and external genitalia normal    No red blood, no active bleeding.  Cervix is long and closed    Dilation: Closed Effacement (%): Thick Cervical Position: Posterior Station: -3 Presentation: Undeterminable Exam by:: Hansel Feinstein CNM  FHT:  Baseline 140 , moderate variability, accelerations present, no decelerations Contractions:  Irregular and mild, not felt by patient   Labs: No results found for this or any previous visit (from the past 24 hour(s)). Blood type A+ by OB records.    Imaging:  US done Placenta is no longer a previa, now anterior Cervical length = 3.11cm  AFI 18.1cm (67%ile) Presentation Cephalic  MAU Course/MDM: I have ordered US to evaluate status of previa, which has now resolved NST reviewed, is reactive with average variability and no deceleraitons Consult Dr Elly Modena. with presentation, exam findings and test results.  She states the red bleeding in small amount may have been hemorrhoid bleeding after BM, given vaginal discharge is brown. May go home with close followup in office.  I recommend she call office in AM for possible reevaluation.   Assessment: Single intrauterine pregnancy at [redacted]w[redacted]d Brown vaginal bleeding, small amount, unclear etiology Small amount of red bleeding at home x 1, unclear etiology Resolved placenta previa, now anterior  Plan: Discharge home Bleeding precautions Come back right away if she has any more bleeding.  Preterm Labor precautions and fetal kick counts Follow up in Office tomorrow for prenatal visits and recheck of bleeding  Encouraged to return here or to other Urgent Care/ED if she develops worsening of symptoms, increase in pain, fever, or other concerning symptoms.   Pt stable at time of discharge.  Hansel Feinstein CNM, MSN Certified Nurse-Midwife 09/09/2018 9:15 PM

## 2018-09-09 NOTE — Discharge Instructions (Signed)
Vaginal Bleeding During Pregnancy, Third Trimester  A small amount of bleeding from the vagina (spotting) is relatively common during pregnancy. Various things can cause bleeding or spotting during pregnancy. Sometimes bleeding is normal and is not a problem. However, bleeding during the third trimester can also be a sign of something serious for the mother and the baby. Be sure to tell your health care provider about any vaginal bleeding right away. Some possible causes of vaginal bleeding during the third trimester include:  Infection or growths (polyps) on the cervix.  A condition in which the placenta partially or completely covers the opening of the cervix inside the uterus (placenta previa).  The placenta separating from the uterus (placenta abruption).  The start of labor (discharging of the mucus plug).  A condition in which the placenta grows into the muscle layer of the uterus (placenta accreta). Follow these instructions at home: Activity  Follow instructions from your health care provider about limiting your activity. If your health care provider recommends activity restriction, you may need to stay in bed and only get up to use the bathroom. In some cases, your health care provider may allow you to continue light activity.  If needed, make plans for someone to help with your regular activities.  Ask your health care provider if it is safe for you to drive.  Do not lift anything that is heavier than 10 lb (4.5 kg), or the limit that your health care provider tells you, until he or she says that it is safe.  Do not have sex or orgasms until your health care provider says that this is safe. Medicines  Take over-the-counter and prescription medicines only as told by your health care provider.  Do not take aspirin because it can cause bleeding. General instructions  Pay attention to any changes in your symptoms.  Write down how many pads you use each day, how often you  change pads, and how soaked (saturated) they are.  Do not use tampons or douche.  If you pass any tissue from your vagina, save the tissue so you can show it to your health care provider.  Keep all follow-up visits as told by your health care provider. This is important. Contact a health care provider if:  You have vaginal bleeding during any part of your pregnancy.  You have cramps or labor pains.  You have a fever. Get help right away if:  You have severe cramps or pain in your back or abdomen.  You have a gush of fluid from the vagina.  You pass large clots or a large amount of tissue from your vagina.  Your bleeding increases.  You feel light-headed or weak.  You faint.  You feel that your baby is moving less than usual, or not moving at all. Summary  Various things can cause bleeding or spotting in pregnancy.  Bleeding during the third trimester can be a sign of a serious problem for the mother and the baby.  Be sure to tell your health care provider about any vaginal bleeding right away. This information is not intended to replace advice given to you by your health care provider. Make sure you discuss any questions you have with your health care provider. Document Released: 05/24/2002 Document Revised: 06/05/2016 Document Reviewed: 06/05/2016 Elsevier Interactive Patient Education  2019 [employer]   Activity Restriction During Pregnancy Your health care provider may recommend specific activity restrictions during pregnancy for a variety of reasons. Activity restriction may require that  you limit activities that require great effort, such as exercise, lifting, or sex. The type of activity restriction will vary for each person, depending on your risk or the problems you are having. Activity restriction may be recommended for a period of time until your baby is delivered. Why are activity restrictions recommended? Activity restriction may be recommended if:  Your  placenta is partially or completely covering the opening of your cervix (placenta previa).  There is bleeding between the wall of the uterus and the amniotic sac in the first trimester of pregnancy (subchorionic hemorrhage).  You went into labor too early (preterm labor).  You have a history of miscarriage.  You have a condition that causes high blood pressure during pregnancy (preeclampsia or eclampsia).  You are pregnant with more than one baby.  Your baby is not growing well. What are the risks? The risks depend on your specific restriction. Strict bed rest has the most physical and emotional risks and is no longer routinely recommended. Risks of strict bed rest include:  Loss of muscle conditioning from not moving.  Blood clots.  Social isolation.  Depression.  Loss of income. Talk with your health care team about activity restriction to decide if it is best for you and your baby. Even if you are having problems during your pregnancy, you may be able to continue with normal levels of activity with careful monitoring by your health care team. Follow these instructions at home: If needed, based on your overall health and the health of your baby, your health care provider will decide which type of activity restriction is right for you. Activity restrictions may include:  Not lifting anything heavier than 10 pounds (4.5 kg).  Avoiding activities that take a lot of physical effort.  No lifting or straining.  Resting in a sitting position or lying down for periods of time during the day. Pelvic rest may be recommended along with activity restrictions. If pelvic rest is recommended, then:  Do not have sex, an orgasm, or use sexual stimulation.  Do not use tampons. Do not douche. Do not put anything into your vagina.  Do not lift anything that is heavier than 10 lb (4.5 kg).  Avoid activities that require a lot of effort.  Avoid any activity in which your pelvic muscles  could become strained, such as squatting. Questions to ask your health care provider  Why is my activity being limited?  How will activity restrictions affect my body?  Why is rest helpful for me and my baby?  What activities can I do?  When can I return to normal activities? When should I seek immediate medical care? Seek immediate medical care if you have:  Vaginal bleeding.  Vaginal discharge.  Cramping pain in your lower abdomen.  Regular contractions.  A low, dull backache. Summary  Your health care provider may recommend specific activity restrictions during pregnancy for a variety of reasons.  Activity restriction may require that you limit activities such as exercise, lifting, sex, or any other activity that requires great effort.  Discuss the risks and benefits of activity restriction with your health care team to decide if it is best for you and your baby.  Contact your health care provider right away if you think you are having contractions, or if you notice vaginal bleeding, discharge, or cramping. This information is not intended to replace advice given to you by your health care provider. Make sure you discuss any questions you have with your health care  provider. Document Released: 06/28/2010 Document Revised: 06/23/2017 Document Reviewed: 06/23/2017 Elsevier Interactive Patient Education  2019 [employer].

## 2018-09-10 ENCOUNTER — Other Ambulatory Visit: Payer: Self-pay | Admitting: Obstetrics & Gynecology

## 2018-09-10 ENCOUNTER — Encounter (HOSPITAL_COMMUNITY): Payer: Self-pay | Admitting: Advanced Practice Midwife

## 2018-09-10 DIAGNOSIS — O4693 Antepartum hemorrhage, unspecified, third trimester: Secondary | ICD-10-CM | POA: Diagnosis present

## 2018-10-14 LAB — OB RESULTS CONSOLE GBS: GBS: NEGATIVE

## 2018-10-25 ENCOUNTER — Inpatient Hospital Stay (EMERGENCY_DEPARTMENT_HOSPITAL)
Admission: AD | Admit: 2018-10-25 | Discharge: 2018-10-26 | Disposition: A | Discharge status: Home / Self Care | Payer: BC Managed Care – PPO | Source: Home / Self Care | Attending: Obstetrics and Gynecology | Admitting: Obstetrics and Gynecology

## 2018-10-25 ENCOUNTER — Encounter (HOSPITAL_COMMUNITY): Payer: Self-pay | Admitting: *Deleted

## 2018-10-25 ENCOUNTER — Other Ambulatory Visit: Payer: Self-pay

## 2018-10-25 DIAGNOSIS — O471 False labor at or after 37 completed weeks of gestation: Secondary | ICD-10-CM | POA: Insufficient documentation

## 2018-10-25 DIAGNOSIS — Z3689 Encounter for other specified antenatal screening: Secondary | ICD-10-CM

## 2018-10-25 DIAGNOSIS — O479 False labor, unspecified: Secondary | ICD-10-CM

## 2018-10-25 DIAGNOSIS — Z3A38 38 weeks gestation of pregnancy: Secondary | ICD-10-CM

## 2018-10-25 HISTORY — DX: Other specified health status: Z78.9

## 2018-10-25 LAB — AMNISURE RUPTURE OF MEMBRANE (ROM) NOT AT ARMC: Amnisure ROM: NEGATIVE

## 2018-10-25 MED ORDER — MORPHINE SULFATE (PF) 4 MG/ML IV SOLN
4.0000 mg | Freq: Once | INTRAVENOUS | Status: AC
  Administered 2018-10-25: 4 mg via INTRAMUSCULAR
  Filled 2018-10-25: qty 1

## 2018-10-25 MED ORDER — ONDANSETRON 4 MG PO TBDP
4.0000 mg | ORAL_TABLET | Freq: Once | ORAL | Status: AC
  Administered 2018-10-25: 4 mg via ORAL
  Filled 2018-10-25: qty 1

## 2018-10-25 MED ORDER — ZOLPIDEM TARTRATE 5 MG PO TABS
5.0000 mg | ORAL_TABLET | Freq: Once | ORAL | Status: AC
  Administered 2018-10-25: 22:00:00 5 mg via ORAL
  Filled 2018-10-25: qty 1

## 2018-10-25 NOTE — MAU Note (Signed)
Having sharp pain in abd and low back.  Has been happening a little over an hour.  This is first preg. No bleeding or leaking. Was 1 cm last Friday.

## 2018-10-25 NOTE — MAU Provider Note (Addendum)
S: Ms. Karen Mcintyre is a 28 y.o. G3P0020 at [redacted]w[redacted]d  who presents to MAU today for labor evaluation.     Cervical exam by RN:  Dilation: 1 Effacement (%): 50 Station: -1 Presentation: Vertex Exam by:: Lauren Cox RN   Fetal Monitoring: Baseline: 145 Variability: Moderate Accelerations: Present Decelerations: None Contractions: Occasional  MDM Discussed patient with RN. NST reviewed.   A: SIUP at [redacted]w[redacted]d  Cat I FT Uterine Contractions  P: Patient requests pain medication and sleep aide. Morphine 4mg  and Ambien 5mg  Given. Patient also reports nausea and requests medication. Zofran 4mg  ODT given. Patient with some labile pressures, but Ramos work up deffered as patient blood pressures back to normal range after treatment of pain.  Discharge home. Labor precautions and kick counts included in AVS. Patient to follow-up with [facility] as scheduled. Patient may return to MAU as needed or when in labor.   Gavin Pound, CNM 10/25/2018 9:46 PM   Addendum  S: Prior to discharge nurse reports patient with continued c/o vaginal leakage despite negative amnisure. Provider to bedside to complete pelvic exam.  O: BP 119/66 (BP Location: Left Arm)   Pulse 75   Temp 98.6 F (37 C) (Oral)   Resp 18   Ht 5\' 3"  (1.6 m)   Wt 91.7 kg   SpO2 100%   BMI 35.80 kg/m  GENERAL: Well-developed, well-nourished female in no acute distress.  HEAD: Normocephalic, atraumatic.  CHEST: Normal effort of breathing, regular heart rate ABDOMEN: Soft, nontender, gravid PELVIC: Normal external female genitalia. Vagina is pink and rugated. Scant amt pink discharge, c/w bloody show, in vault.Cervix not visualized d/t patient discomfort. Negative pooling. [place].  Cervical exam:  Dilation: 1 Effacement (%): 80 Station: -2 Presentation: Vertex Exam by:: Gavin Pound cnm   Results for orders placed or performed during the hospital encounter of 10/25/18 (from the past 24 hour(s))  Amnisure  rupture of membrane (rom)not at [facility]     Status: None   Collection Time: 10/25/18  7:38 PM  Result Value Ref Range   Amnisure ROM NEGATIVE   Fern Test     Status: Normal   Collection Time: 10/26/18  1:08 AM  Result Value Ref Range   POCT Fern Test Negative = intact amniotic membranes      A: SIUP at [redacted]w[redacted]d  Membranes intact   P: NST remains reactive. BP remain normal range.  Will discharge to home. Encouraged to return if contractions worsen.   Gavin Pound, CNM 10/26/2018 3:31 AM

## 2018-10-25 NOTE — MAU Note (Addendum)
Pt reports she had a gush of fluid or discharge. Fern slide collected. Fern negative.

## 2018-10-26 ENCOUNTER — Encounter (HOSPITAL_COMMUNITY): Payer: Self-pay

## 2018-10-26 ENCOUNTER — Inpatient Hospital Stay (HOSPITAL_COMMUNITY): Payer: BC Managed Care – PPO | Admitting: Anesthesiology

## 2018-10-26 ENCOUNTER — Inpatient Hospital Stay (HOSPITAL_COMMUNITY)
Admission: AD | Admit: 2018-10-26 | Discharge: 2018-10-28 | DRG: 807 | Disposition: A | Discharge status: Home / Self Care | Payer: BC Managed Care – PPO | Attending: Obstetrics & Gynecology | Admitting: Obstetrics & Gynecology

## 2018-10-26 DIAGNOSIS — Z3A38 38 weeks gestation of pregnancy: Secondary | ICD-10-CM

## 2018-10-26 DIAGNOSIS — Z20828 Contact with and (suspected) exposure to other viral communicable diseases: Secondary | ICD-10-CM | POA: Diagnosis present

## 2018-10-26 DIAGNOSIS — O26893 Other specified pregnancy related conditions, third trimester: Secondary | ICD-10-CM | POA: Diagnosis present

## 2018-10-26 DIAGNOSIS — O3663X0 Maternal care for excessive fetal growth, third trimester, not applicable or unspecified: Principal | ICD-10-CM | POA: Diagnosis present

## 2018-10-26 DIAGNOSIS — O9902 Anemia complicating childbirth: Secondary | ICD-10-CM | POA: Diagnosis present

## 2018-10-26 DIAGNOSIS — O99844 Bariatric surgery status complicating childbirth: Secondary | ICD-10-CM | POA: Diagnosis present

## 2018-10-26 DIAGNOSIS — D649 Anemia, unspecified: Secondary | ICD-10-CM | POA: Diagnosis present

## 2018-10-26 LAB — ABO/RH: ABO/RH(D): A POS

## 2018-10-26 LAB — CBC
HCT: 30.3 % — ABNORMAL LOW (ref 36.0–46.0)
Hemoglobin: 9.7 g/dL — ABNORMAL LOW (ref 12.0–15.0)
MCH: 25.7 pg — ABNORMAL LOW (ref 26.0–34.0)
MCHC: 32 g/dL (ref 30.0–36.0)
MCV: 80.4 fL (ref 80.0–100.0)
Platelets: 304 10*3/uL (ref 150–400)
RBC: 3.77 MIL/uL — ABNORMAL LOW (ref 3.87–5.11)
RDW: 12.6 % (ref 11.5–15.5)
WBC: 14.7 10*3/uL — ABNORMAL HIGH (ref 4.0–10.5)
nRBC: 0.1 % (ref 0.0–0.2)

## 2018-10-26 LAB — TYPE AND SCREEN
ABO/RH(D): A POS
Antibody Screen: NEGATIVE

## 2018-10-26 LAB — SARS CORONAVIRUS 2 BY RT PCR (HOSPITAL ORDER, PERFORMED IN ~~LOC~~ HOSPITAL LAB): SARS Coronavirus 2: NEGATIVE

## 2018-10-26 LAB — RPR: RPR Ser Ql: NONREACTIVE

## 2018-10-26 LAB — POCT FERN TEST
POCT Fern Test: NEGATIVE
POCT Fern Test: POSITIVE

## 2018-10-26 MED ORDER — ONDANSETRON HCL 4 MG/2ML IJ SOLN: 4.0000 mg | Freq: Four times a day (QID) | INTRAMUSCULAR | Status: DC | PRN

## 2018-10-26 MED ORDER — COCONUT OIL OIL: 1.0000 "application " | TOPICAL_OIL | Status: DC | PRN

## 2018-10-26 MED ORDER — OXYTOCIN 40 UNITS IN NORMAL SALINE INFUSION - SIMPLE MED
1.0000 m[IU]/min | INTRAVENOUS | Status: DC
  Administered 2018-10-26: 2 m[IU]/min via INTRAVENOUS

## 2018-10-26 MED ORDER — IBUPROFEN 600 MG PO TABS
600.0000 mg | ORAL_TABLET | Freq: Four times a day (QID) | ORAL | Status: DC
  Administered 2018-10-26 – 2018-10-28 (×7): 600 mg via ORAL
  Filled 2018-10-26 (×7): qty 1

## 2018-10-26 MED ORDER — FLEET ENEMA 7-19 GM/118ML RE ENEM: 1.0000 | ENEMA | RECTAL | Status: DC | PRN

## 2018-10-26 MED ORDER — WITCH HAZEL-GLYCERIN EX PADS: 1.0000 "application " | MEDICATED_PAD | CUTANEOUS | Status: DC | PRN

## 2018-10-26 MED ORDER — DIPHENHYDRAMINE HCL 50 MG/ML IJ SOLN: 12.5000 mg | INTRAMUSCULAR | Status: DC | PRN

## 2018-10-26 MED ORDER — ONDANSETRON HCL 4 MG PO TABS: 4.0000 mg | ORAL_TABLET | ORAL | Status: DC | PRN

## 2018-10-26 MED ORDER — OXYTOCIN 40 UNITS IN NORMAL SALINE INFUSION - SIMPLE MED
2.5000 [IU]/h | INTRAVENOUS | Status: DC
  Filled 2018-10-26: qty 1000

## 2018-10-26 MED ORDER — BUTORPHANOL TARTRATE 1 MG/ML IJ SOLN
INTRAMUSCULAR | Status: AC
  Filled 2018-10-26: qty 2

## 2018-10-26 MED ORDER — LIDOCAINE HCL (PF) 1 % IJ SOLN: 30.0000 mL | INTRAMUSCULAR | Status: DC | PRN

## 2018-10-26 MED ORDER — TETANUS-DIPHTH-ACELL PERTUSSIS 5-2.5-18.5 LF-MCG/0.5 IM SUSP: 0.5000 mL | Freq: Once | INTRAMUSCULAR | Status: DC

## 2018-10-26 MED ORDER — FENTANYL-BUPIVACAINE-NACL 0.5-0.125-0.9 MG/250ML-% EP SOLN: 12.0000 mL/h | EPIDURAL | Status: DC | PRN

## 2018-10-26 MED ORDER — PHENYLEPHRINE 40 MCG/ML (10ML) SYRINGE FOR IV PUSH (FOR BLOOD PRESSURE SUPPORT)
80.0000 ug | PREFILLED_SYRINGE | INTRAVENOUS | Status: AC | PRN
  Administered 2018-10-26 (×3): 80 ug via INTRAVENOUS

## 2018-10-26 MED ORDER — SENNOSIDES-DOCUSATE SODIUM 8.6-50 MG PO TABS
2.0000 | ORAL_TABLET | ORAL | Status: DC
  Administered 2018-10-26 – 2018-10-28 (×2): 2 via ORAL
  Filled 2018-10-26 (×2): qty 2

## 2018-10-26 MED ORDER — OXYCODONE-ACETAMINOPHEN 5-325 MG PO TABS: 1.0000 | ORAL_TABLET | ORAL | Status: DC | PRN

## 2018-10-26 MED ORDER — SOD CITRATE-CITRIC ACID 500-334 MG/5ML PO SOLN: 30.0000 mL | ORAL | Status: DC | PRN

## 2018-10-26 MED ORDER — DIPHENHYDRAMINE HCL 25 MG PO CAPS: 25.0000 mg | ORAL_CAPSULE | Freq: Four times a day (QID) | ORAL | Status: DC | PRN

## 2018-10-26 MED ORDER — OXYCODONE-ACETAMINOPHEN 5-325 MG PO TABS: 2.0000 | ORAL_TABLET | ORAL | Status: DC | PRN

## 2018-10-26 MED ORDER — LIDOCAINE HCL (PF) 1 % IJ SOLN
INTRAMUSCULAR | Status: DC | PRN
  Administered 2018-10-26: 10 mL via EPIDURAL

## 2018-10-26 MED ORDER — EPHEDRINE 5 MG/ML INJ: 10.0000 mg | INTRAVENOUS | Status: DC | PRN

## 2018-10-26 MED ORDER — LACTATED RINGERS AMNIOINFUSION
INTRAVENOUS | Status: DC
  Administered 2018-10-26: 11:00:00 via INTRAUTERINE

## 2018-10-26 MED ORDER — ACETAMINOPHEN 325 MG PO TABS
650.0000 mg | ORAL_TABLET | ORAL | Status: DC | PRN
  Administered 2018-10-26: 650 mg via ORAL
  Filled 2018-10-26 (×2): qty 2

## 2018-10-26 MED ORDER — TERBUTALINE SULFATE 1 MG/ML IJ SOLN: 0.2500 mg | Freq: Once | INTRAMUSCULAR | Status: DC | PRN

## 2018-10-26 MED ORDER — ACETAMINOPHEN 325 MG PO TABS: 650.0000 mg | ORAL_TABLET | ORAL | Status: DC | PRN

## 2018-10-26 MED ORDER — PHENYLEPHRINE 40 MCG/ML (10ML) SYRINGE FOR IV PUSH (FOR BLOOD PRESSURE SUPPORT)
PREFILLED_SYRINGE | INTRAVENOUS | Status: AC
  Filled 2018-10-26: qty 10

## 2018-10-26 MED ORDER — PHENYLEPHRINE 40 MCG/ML (10ML) SYRINGE FOR IV PUSH (FOR BLOOD PRESSURE SUPPORT)
80.0000 ug | PREFILLED_SYRINGE | INTRAVENOUS | Status: DC | PRN
  Administered 2018-10-26: 80 ug via INTRAVENOUS
  Filled 2018-10-26: qty 10

## 2018-10-26 MED ORDER — SIMETHICONE 80 MG PO CHEW
80.0000 mg | CHEWABLE_TABLET | ORAL | Status: DC | PRN
  Administered 2018-10-27 – 2018-10-28 (×2): 80 mg via ORAL
  Filled 2018-10-26 (×3): qty 1

## 2018-10-26 MED ORDER — SODIUM CHLORIDE (PF) 0.9 % IJ SOLN
INTRAMUSCULAR | Status: DC | PRN
  Administered 2018-10-26: 12 mL/h via EPIDURAL

## 2018-10-26 MED ORDER — OXYTOCIN BOLUS FROM INFUSION
500.0000 mL | Freq: Once | INTRAVENOUS | Status: AC
  Administered 2018-10-26: 500 mL via INTRAVENOUS

## 2018-10-26 MED ORDER — ONDANSETRON HCL 4 MG/2ML IJ SOLN: 4.0000 mg | INTRAMUSCULAR | Status: DC | PRN

## 2018-10-26 MED ORDER — DIBUCAINE (PERIANAL) 1 % EX OINT: 1.0000 "application " | TOPICAL_OINTMENT | CUTANEOUS | Status: DC | PRN

## 2018-10-26 MED ORDER — LACTATED RINGERS IV SOLN
INTRAVENOUS | Status: DC
  Administered 2018-10-26 (×3): via INTRAVENOUS

## 2018-10-26 MED ORDER — BENZOCAINE-MENTHOL 20-0.5 % EX AERO
1.0000 "application " | INHALATION_SPRAY | CUTANEOUS | Status: DC | PRN
  Administered 2018-10-27: 1 via TOPICAL
  Filled 2018-10-26: qty 56

## 2018-10-26 MED ORDER — BUTORPHANOL TARTRATE 1 MG/ML IJ SOLN
2.0000 mg | Freq: Once | INTRAMUSCULAR | Status: AC
  Administered 2018-10-26: 06:00:00 2 mg via INTRAVENOUS

## 2018-10-26 MED ORDER — PRENATAL MULTIVITAMIN CH
1.0000 | ORAL_TABLET | Freq: Every day | ORAL | Status: DC
  Administered 2018-10-27 – 2018-10-28 (×2): 1 via ORAL
  Filled 2018-10-26 (×2): qty 1

## 2018-10-26 MED ORDER — ZOLPIDEM TARTRATE 5 MG PO TABS: 5.0000 mg | ORAL_TABLET | Freq: Every evening | ORAL | Status: DC | PRN

## 2018-10-26 MED ORDER — MISOPROSTOL 200 MCG PO TABS
ORAL_TABLET | ORAL | Status: AC
  Filled 2018-10-26: qty 5

## 2018-10-26 MED ORDER — LACTATED RINGERS IV SOLN
500.0000 mL | Freq: Once | INTRAVENOUS | Status: AC
  Administered 2018-10-26: 500 mL via INTRAVENOUS

## 2018-10-26 MED ORDER — FENTANYL-BUPIVACAINE-NACL 0.5-0.125-0.9 MG/250ML-% EP SOLN
EPIDURAL | Status: AC
  Filled 2018-10-26: qty 250

## 2018-10-26 MED ORDER — LACTATED RINGERS IV SOLN
500.0000 mL | INTRAVENOUS | Status: DC | PRN
  Administered 2018-10-26: 1000 mL via INTRAVENOUS

## 2018-10-26 MED ORDER — MISOPROSTOL 200 MCG PO TABS
1000.0000 ug | ORAL_TABLET | Freq: Once | ORAL | Status: AC
  Administered 2018-10-26: 16:00:00 1000 ug via RECTAL

## 2018-10-26 NOTE — Progress Notes (Signed)
I was asked by Dr. Murrell Redden to assess cervical exam, place IUPC and begin amnioinfusion for variable decelerations. S:  Pt. Comfortable with epidural and resting on left lateral side with peanut ball. Discussed procedure and patient agrees.   O:  Pitocin at 2 milliunits  VS: Blood pressure 110/65, pulse 66, temperature 97.7 F (36.5 C), temperature source Axillary, resp. rate 18, SpO2 99 %, unknown if currently breastfeeding.        FHR : baseline 120 bpm / variability minimal to moderate / accelerations + / occasional variable decelerations        Toco: contractions every 4 minutes / moderate         Cervix : 9/100/-2        Membranes: clear/pink tinged fluid        IUPC placed without difficulty   A: Protracted active labor     FHR category 2     GBS negative     Suspected LGA  P: Continue pitocin augmentation     Amnioinfusion 359mL bolus, then 168mL/hr      Dr. Murrell Redden updated with exam and she will resume care    Anticipated MOD: guarded, pelvis feels adequate, but high station and inadequate contractions     Lars Pinks, MSN, CNM [facility]

## 2018-10-26 NOTE — MAU Note (Signed)
Pt reports to MAU c/o possible SROM @0500  clear fluid. Pt reports +FM. Pt reports ctx every 2-4 min.

## 2018-10-26 NOTE — Progress Notes (Signed)
Kanaya KATTY FRETWELL is a 28 y.o. G3P0020 at [redacted]w[redacted]d by 1st trim sono. Active labor, prolonged last phase of stage 1, but now complete and feels some pressure.  Pitocin augmentation since UCs spaced out. Amnioinfusion for variable decels.  Epidural working well  Objective: BP 118/70   Pulse 89   Temp 98.5 F (36.9 C) (Axillary)   Resp 20   SpO2 99%   FHT:  FHR: chane in baseline for some time from 150s to 180, now back to 150s, bpm, variability: moderate,  accelerations:  Present,  decelerations:  Absent UC:   regular, every 3 minutes SVE:  Complete/ +2/ Vx caput   Assessment / Plan: Augmentation of labor, progressing well  Labor: Progressing normally GBS(-) Fetal Wellbeing:  Category I Pain Control:  Epidural  SVD prepare room for shoulder dystocia   Elveria Royals 10/26/2018, 3:03 PM

## 2018-10-26 NOTE — H&P (Addendum)
Karen Mcintyre is a 28 y.o. G3P0020 at [redacted]w[redacted]d gestation presents for complaint of Contractions and Loss of fluid.  Denies vaginal bleeding and reports good FM.  SROM at 0500 with clear fluid.  Antepartum course: h/o gastric bypass, S>D with LGA fetus; EFW 89% (7'14") with AC 98%; suspected early twin gestation with 2 GS but one fetus noted; clomid/ovidrel conception [facility] at [place] since 12 wks.  See complete pre-natal records  History OB History    Gravida  3   Para  0   Term      Preterm      AB  2   Living  0     SAB  2   TAB      Ectopic      Multiple      Live Births  0        Obstetric Comments  2020 started as twin gestation, only one developed       Past Medical History:  Diagnosis Date  . Epigastric pain 03-2014   Possible Internal Hernia  . Medical history non-contributory   . Obesity   . Obesity    Past Surgical History:  Procedure Laterality Date  . GASTRIC ROUX-EN-Y N/A 08/01/2013   Procedure: LAPAROSCOPIC ROUX-EN-Y GASTRIC BYPASS WITH UPPER ENDOSCOPY WITH LAPAROSCOPIC REPAIR OF HIATAL HERNIA;  Surgeon: Karen Curry, MD;  Location: [facility];  Service: General;  Laterality: N/A;  . LAPAROSCOPIC INTERNAL HERNIA REPAIR N/A 04/12/2014   Procedure: REDUCTION OF INTERNAL HERNIA REPAIR;  Surgeon: Karen Curry, MD;  Location: [facility];  Service: General;  Laterality: N/A;  . LAPAROSCOPIC LYSIS OF ADHESIONS  04/12/2014   Procedure: LAPAROSCOPIC LYSIS OF ADHESIONS;  Surgeon: Karen Curry, MD;  Location: [facility];  Service: General;;  . LAPAROSCOPY N/A 04/12/2014   Procedure: LAPAROSCOPY DIAGNOSTIC;  Surgeon: Karen Curry, MD;  Location: [facility];  Service: General;  Laterality: N/A;  . wisdom teeth extraction     Family History: family history includes Alcohol abuse in her father; Cancer in her paternal grandmother; Hypertension in her mother; Obesity in an other family member. Social History:  reports that she has never smoked. She has never used  smokeless tobacco. She reports that she does not drink alcohol or use drugs.  ROS: See above otherwise negative  Prenatal labs:  ABO, Rh: --/--/A POS (08/11 6789) Antibody: NEG (08/11 3810) Rubella:  immune RPR:   neg HBsAg:   neg HIV:  neg GBS: Negative (07/30 0000)  1 hr Glucola: Normal Genetic screening: Normal Anatomy US: Normal  Physical Exam:   Dilation: 9 Effacement (%): 100 Station: -2 Exam by:: Dr. Murrell Mcintyre Blood pressure 100/61, pulse 71, temperature 98.6 F (37 C), temperature source Oral, resp. rate 18, SpO2 99 %, unknown if currently breastfeeding. A&O x 3 HEENT:grossly Normal Lungs: CTAB CV: RRR Abdominal: Soft, Non-tender, Gravid and Estimated fetal weight: 8lbs lbs  Lower Extremities: Non-edematous, Non-tender  Pelvic Exam:      Dilatation: 9cm     Effacement: 100%     Station: -2     Presentation: Cephalic  Labs:  CBC:  Lab Results  Component Value Date   WBC 14.7 (H) 10/26/2018   RBC 3.77 (L) 10/26/2018   HGB 9.7 (L) 10/26/2018   HCT 30.3 (L) 10/26/2018   MCV 80.4 10/26/2018   MCH 25.7 (L) 10/26/2018   MCHC 32.0 10/26/2018   RDW 12.6 10/26/2018   PLT 304 10/26/2018   CMP:  Lab Results  Component Value Date   NA 137 04/13/2014   K 3.8 04/13/2014   CL 104 04/13/2014   CO2 26 04/13/2014   GLUCOSE 134 (H) 04/13/2014   BUN 6 04/13/2014   CREATININE 0.68 04/13/2014   CALCIUM 8.7 04/13/2014   PROT 7.6 04/10/2014   AST 16 04/10/2014   ALT 13 04/10/2014   ALBUMIN 4.7 04/10/2014   ALKPHOS 50 04/10/2014   BILITOT 0.5 04/10/2014   GFRNONAA >90 04/13/2014   GFRAA >90 04/13/2014   ANIONGAP 7 04/13/2014   Urine: Lab Results  Component Value Date   COLORURINE Karen Mcintyre (A) 04/10/2014   APPEARANCEUR CLOUDY (A) 04/10/2014   LABSPEC 1.041 (H) 04/10/2014   PHURINE 5.0 04/10/2014   GLUCOSEU NEGATIVE 04/10/2014   HGBUR NEGATIVE 04/10/2014   BILIRUBINUR small 05/31/2014   KETONESUR 15 (A) 04/10/2014   PROTEINUR trace 05/31/2014   NITRITE  positive 05/31/2014   LEUKOCYTESUR moderate (2+) 05/31/2014     Prenatal Transfer Tool  Maternal Diabetes: No Genetic Screening: Normal Maternal Ultrasounds/Referrals: Normal Fetal Ultrasounds or other Referrals:  None Maternal Substance Abuse:  No Significant Maternal Medications:  None Significant Maternal Lab Results: Group B Strep negative  FHT:  120s, nml variability, +accels, occasional early decels to 90s or few to 60s with return to baseline and good variability and subsequent accels; variables present TOCO: q 2-3 min  Assessment/Plan:  28 y.o. G3P0020 at 408w4d gestation   1. SROM/active labor - admit for delivery; guarded mode for delivery as still high fetal station and known LGA; contin plan for svd but reviewed status with patient; pt has been counseled by primary provider about large fetus and risk for c/s and shoulder dystocia; will follow progress closely 2. Fetal status overall reassuring, monitor closely; consider amnioinfusion if deep variables persist 3. H/o gastric bypass 4. Initial 2 GS but no second fetus developed, nml genetic screening 5. gbs neg  6.   Anemia of pregnancy - monitor pp  Karen FreesSusan Mcintyre  10/26/2018, 9:37 AM

## 2018-10-26 NOTE — Anesthesia Procedure Notes (Signed)
Epidural Patient location during procedure: OB Start time: 10/26/2018 7:03 AM End time: 10/26/2018 7:14 AM  Staffing Anesthesiologist: Lidia Collum, MD Performed: anesthesiologist   Preanesthetic Checklist Completed: patient identified, pre-op evaluation, timeout performed, IV checked, risks and benefits discussed and monitors and equipment checked  Epidural Patient position: sitting Prep: DuraPrep Patient monitoring: heart rate, continuous pulse ox and blood pressure Approach: midline Location: L3-L4 Injection technique: LOR air  Needle:  Needle type: Tuohy  Needle gauge: 17 G Needle length: 9 cm Needle insertion depth: 7 cm Catheter type: closed end flexible Catheter size: 19 Gauge Catheter at skin depth: 11 cm Test dose: negative  Assessment Events: blood not aspirated, injection not painful, no injection resistance, negative IV test and no paresthesia  Additional Notes Reason for block:procedure for pain

## 2018-10-26 NOTE — Discharge Instructions (Signed)

## 2018-10-26 NOTE — Anesthesia Preprocedure Evaluation (Signed)

## 2018-10-27 LAB — CBC
HCT: 25.6 % — ABNORMAL LOW (ref 36.0–46.0)
Hemoglobin: 8 g/dL — ABNORMAL LOW (ref 12.0–15.0)
MCH: 25.7 pg — ABNORMAL LOW (ref 26.0–34.0)
MCHC: 31.3 g/dL (ref 30.0–36.0)
MCV: 82.3 fL (ref 80.0–100.0)
Platelets: 234 10*3/uL (ref 150–400)
RBC: 3.11 MIL/uL — ABNORMAL LOW (ref 3.87–5.11)
RDW: 13 % (ref 11.5–15.5)
WBC: 10.9 10*3/uL — ABNORMAL HIGH (ref 4.0–10.5)
nRBC: 0 % (ref 0.0–0.2)

## 2018-10-27 MED ORDER — SODIUM CHLORIDE 0.9 % IV SOLN
510.0000 mg | Freq: Once | INTRAVENOUS | Status: AC
  Administered 2018-10-27: 510 mg via INTRAVENOUS
  Filled 2018-10-27: qty 17

## 2018-10-27 NOTE — Anesthesia Postprocedure Evaluation (Signed)
Anesthesia Post Note  Patient: Karen Mcintyre  Procedure(s) Performed: AN AD HOC LABOR EPIDURAL     Patient location during evaluation: Mother Baby Anesthesia Type: Epidural Level of consciousness: awake Pain management: satisfactory to patient Vital Signs Assessment: post-procedure vital signs reviewed and stable Respiratory status: spontaneous breathing Cardiovascular status: stable Anesthetic complications: no    Last Vitals:  Vitals:   10/27/18 0324 10/27/18 0758  BP: 125/75 122/88  Pulse: 77 62  Resp: 18 17  Temp: 36.9 C 36.7 C  SpO2: 100%     Last Pain:  Vitals:   10/27/18 0800  TempSrc:   PainSc: 0-No pain   Pain Goal: Patients Stated Pain Goal: 0 (10/26/18 0923)              Epidural/Spinal Function Cutaneous sensation: Normal sensation (10/27/18 0800)  Casimer Lanius

## 2018-10-27 NOTE — Progress Notes (Signed)
Post Partum Day 1, SVD, 2nd degree perineal lac.  Boy 7'13".  Subjective: no complaints, up ad lib, voiding, tolerating PO, + flatus and not much bleeding  Objective: Blood pressure 122/88, pulse 62, temperature 98.1 F (36.7 C), temperature source Oral, resp. rate 17, SpO2 100 %, unknown if currently breastfeeding.\  Physical Exam:  General: alert, cooperative and appears stated age Lochia: appropriate Uterine Fundus: firm, 2 cm below U Incision: healing well DVT Evaluation: No evidence of DVT seen on physical exam.  Recent Labs    10/26/18 0619 10/27/18 0539  HGB 9.7* 8.0*  HCT 30.3* 25.6*   Rh pos Rub Imm   Assessment/Plan: PPD #1. SVD. Boy.  Routine PP care. Chronic anemia, H/o gastric bypass, reviewed IV Feraheme, agrees.  PP care, pericare, warning s/s reviewed.  Pt desires early discharge today. Will have labs for baby at 4 pm then assess plan.  Circ desired, reviewed procedure/ r/b/c. Awaiting one more feed before procedure    LOS: 1 day   Elveria Royals 10/27/2018, 9:33 AM

## 2018-10-28 DIAGNOSIS — O9902 Anemia complicating childbirth: Secondary | ICD-10-CM | POA: Diagnosis present

## 2018-10-28 MED ORDER — ACETAMINOPHEN 325 MG PO TABS: 650.0000 mg | ORAL_TABLET | ORAL | Status: DC | PRN

## 2018-10-28 MED ORDER — BENZOCAINE-MENTHOL 20-0.5 % EX AERO: 1.0000 "application " | INHALATION_SPRAY | CUTANEOUS | Status: DC | PRN

## 2018-10-28 MED ORDER — IBUPROFEN 600 MG PO TABS: 600.0000 mg | ORAL_TABLET | Freq: Four times a day (QID) | ORAL | 0 refills | Status: DC

## 2018-10-28 NOTE — Discharge Summary (Signed)
Obstetric Discharge Summary Reason for Admission: onset of labor and rupture of membranes Prenatal Procedures: ultrasound Intrapartum Procedures: spontaneous vaginal delivery and epidura; Postpartum Procedures: Iron infusion Complications-Operative and Postpartum: 2nd degree perineal laceration Hemoglobin  Date Value Ref Range Status  10/27/2018 8.0 (L) 12.0 - 15.0 g/dL Final   HCT  Date Value Ref Range Status  10/27/2018 25.6 (L) 36.0 - 46.0 % Final    Physical Exam:  General: alert, cooperative and no distress Lochia: appropriate Uterine Fundus: firm Incision: healing well DVT Evaluation: No significant calf/ankle edema. Positive Homan's sign.  Discharge Diagnoses: Principal Problem:   Postpartum care following vaginal delivery (8/11) Active Problems:   SVD (spontaneous vaginal delivery)   Second degree perineal laceration   Maternal anemia, with delivery   Discharge Information: Date: 10/28/2018 Activity: pelvic rest Diet: routine Medications:  Allergies as of 10/28/2018      Reactions   Diphenhydramine Hcl Hives   Nitrofurantoin Other (See Comments)   unknown      Medication List    TAKE these medications   acetaminophen 325 MG tablet Commonly known as: Tylenol Take 2 tablets (650 mg total) by mouth every 4 (four) hours as needed (for pain scale < 4).   benzocaine-Menthol 20-0.5 % Aero Commonly known as: DERMOPLAST Apply 1 application topically as needed for irritation (perineal discomfort).   calcium-vitamin D 500-200 MG-UNIT tablet Commonly known as: OSCAL WITH D Take 1 tablet by mouth.   ibuprofen 600 MG tablet Commonly known as: ADVIL Take 1 tablet (600 mg total) by mouth every 6 (six) hours.   prenatal multivitamin Tabs tablet Take 1 tablet by mouth daily at 12 noon.   vitamin B-12 100 MCG tablet Commonly known as: CYANOCOBALAMIN Take 100 mcg by mouth daily. Reported on 03/20/2015            Discharge Care Instructions  (From admission,  onward)         Start     Ordered   10/28/18 0000  Discharge wound care:    Comments: Sitz baths 2 times /day with warm water x 1 week. May add herbals: 1 ounce dried comfrey leaf* 1 ounce calendula flowers 1 ounce lavender flowers 1/2 ounce dried uva ursi leaves 1/2 ounce witch hazel blossoms (if you can find them) 1/2 ounce dried sage leaf 1/2 cup sea salt Directions: Bring 2 quarts of water to a boil. Turn off heat, and place 1 ounce (approximately 1 large handful) of the above mixed herbs (not the salt) into the pot. Steep, covered, for 30 minutes.  Strain the liquid well with a fine mesh strainer, and discard the herb material. Add 2 quarts of liquid to the tub, along with the 1/2 cup of salt. This medicinal liquid can also be made into compresses and peri-rinses.   10/28/18 1119         Condition: stable Instructions: refer to practice specific booklet Discharge to: home Follow-up Information    Obgyn, [facility]. Schedule an appointment as soon as possible for a visit in 6 week(s).   Contact information: [place] [address] [phone]           Newborn Data: Live born female Luisa Hart Birth Weight: 7 lb 13.9 oz (3569 g) APGAR: 14, 9  Newborn Delivery   Birth date/time: 10/26/2018 15:55:00 Delivery type: Vaginal, Spontaneous      Home with mother.  Juliene Pina, CNM 10/28/2018, 11:21 AM

## 2018-10-28 NOTE — Progress Notes (Signed)
Patient ID: Karen Mcintyre, female   DOB: [birth year], 28 y.o.   MRN: [id number] Post Partum Day #2  S/P NSVB Live born female  Birth Weight: 7 lb 13.9 oz (3569 g) APGAR: 9, 9  Newborn Delivery   Birth date/time: 10/26/2018 15:55:00 Delivery type: Vaginal, Spontaneous     Karen Mcintyre Delivering provider: MODY, VAISHALI   circumcision completed Feeding: bottle  Pain control at delivery: Epidural   Subjective: No HA, SOB, CP, breast symptoms.  Pain minimal.  Normal vaginal bleeding, no clots.   Voiding freely.    Objective:  VS:  Vitals:   10/27/18 1553 10/27/18 1900 10/27/18 2138 10/28/18 0518  BP: 123/79  133/79 134/81  Pulse: 66  93 65  Resp: 18  18 16   Temp: 98.1 F (36.7 C)  97.9 F (36.6 C) 97.7 F (36.5 C)  TempSrc: Oral  Oral Oral  SpO2: 100%  99% 98%  Weight:  91.7 kg    Height:  5\' 3"  (1.6 m)       Intake/Output Summary (Last 24 hours) at 10/28/2018 4970 Last data filed at 10/28/2018 0300 Gross per 24 hour  Intake 569.2 ml  Output -  Net 569.2 ml      Recent Labs    10/26/18 0619 10/27/18 0539  WBC 14.7* 10.9*  HGB 9.7* 8.0*  HCT 30.3* 25.6*  PLT 304 234    Blood type: --/--/A POS, A POS (08/11 0619) Rubella:   immune Vaccines: TDaP UTD         Flu    NA  Physical Exam:  General: alert, cooperative and no distress Uterine Fundus: firm Lochia: appropriate Perineum: repair intact, no edema DVT Evaluation: No cords or calf tenderness. No significant calf/ankle edema.    Assessment/Plan: PPD # 2 / 28 y.o., [id number]    Principal Problem:   Postpartum care following vaginal delivery (8/11) Active Problems:   SVD (spontaneous vaginal delivery)   Second degree perineal laceration   Maternal anemia, with delivery  - s/p Feraheme  - hx gastric bypass, cannot tolerate Iron tabs, discussed Floradix   normal postpartum exam  Continue current postpartum care             DC home today w/ instructions  F/U at [facility] in 6 weeks and  PRN   LOS: 2 days   Juliene Pina, CNM, MSN 10/28/2018, 9:21 AM

## 2018-12-27 ENCOUNTER — Emergency Department (HOSPITAL_BASED_OUTPATIENT_CLINIC_OR_DEPARTMENT_OTHER): Payer: BC Managed Care – PPO

## 2018-12-27 ENCOUNTER — Ambulatory Visit (HOSPITAL_BASED_OUTPATIENT_CLINIC_OR_DEPARTMENT_OTHER)
Admission: EM | Admit: 2018-12-27 | Discharge: 2018-12-29 | Disposition: A | Discharge status: Home / Self Care | Payer: BC Managed Care – PPO | Attending: General Surgery | Admitting: General Surgery

## 2018-12-27 ENCOUNTER — Emergency Department (HOSPITAL_COMMUNITY)
Admission: EM | Admit: 2018-12-27 | Discharge: 2018-12-27 | Disposition: A | Discharge status: Home / Self Care | Payer: BC Managed Care – PPO | Source: Home / Self Care

## 2018-12-27 ENCOUNTER — Other Ambulatory Visit: Payer: Self-pay

## 2018-12-27 ENCOUNTER — Encounter (HOSPITAL_COMMUNITY): Payer: Self-pay | Admitting: Emergency Medicine

## 2018-12-27 ENCOUNTER — Encounter (HOSPITAL_BASED_OUTPATIENT_CLINIC_OR_DEPARTMENT_OTHER): Payer: Self-pay

## 2018-12-27 DIAGNOSIS — R109 Unspecified abdominal pain: Secondary | ICD-10-CM | POA: Diagnosis present

## 2018-12-27 DIAGNOSIS — R1031 Right lower quadrant pain: Secondary | ICD-10-CM | POA: Insufficient documentation

## 2018-12-27 DIAGNOSIS — Z6831 Body mass index (BMI) 31.0-31.9, adult: Secondary | ICD-10-CM | POA: Diagnosis not present

## 2018-12-27 DIAGNOSIS — Z888 Allergy status to other drugs, medicaments and biological substances status: Secondary | ICD-10-CM | POA: Diagnosis not present

## 2018-12-27 DIAGNOSIS — Z9884 Bariatric surgery status: Secondary | ICD-10-CM | POA: Diagnosis not present

## 2018-12-27 DIAGNOSIS — Z881 Allergy status to other antibiotic agents status: Secondary | ICD-10-CM | POA: Diagnosis not present

## 2018-12-27 DIAGNOSIS — K660 Peritoneal adhesions (postprocedural) (postinfection): Secondary | ICD-10-CM | POA: Diagnosis not present

## 2018-12-27 DIAGNOSIS — K562 Volvulus: Secondary | ICD-10-CM

## 2018-12-27 DIAGNOSIS — E669 Obesity, unspecified: Secondary | ICD-10-CM | POA: Diagnosis not present

## 2018-12-27 DIAGNOSIS — K469 Unspecified abdominal hernia without obstruction or gangrene: Secondary | ICD-10-CM | POA: Insufficient documentation

## 2018-12-27 DIAGNOSIS — Z975 Presence of (intrauterine) contraceptive device: Secondary | ICD-10-CM | POA: Diagnosis not present

## 2018-12-27 DIAGNOSIS — Z5321 Procedure and treatment not carried out due to patient leaving prior to being seen by health care provider: Secondary | ICD-10-CM | POA: Insufficient documentation

## 2018-12-27 DIAGNOSIS — Z20828 Contact with and (suspected) exposure to other viral communicable diseases: Secondary | ICD-10-CM | POA: Insufficient documentation

## 2018-12-27 DIAGNOSIS — K3580 Unspecified acute appendicitis: Secondary | ICD-10-CM | POA: Diagnosis not present

## 2018-12-27 LAB — CBC
HCT: 35.7 % — ABNORMAL LOW (ref 36.0–46.0)
Hemoglobin: 11.6 g/dL — ABNORMAL LOW (ref 12.0–15.0)
MCH: 27.2 pg (ref 26.0–34.0)
MCHC: 32.5 g/dL (ref 30.0–36.0)
MCV: 83.6 fL (ref 80.0–100.0)
Platelets: 210 10*3/uL (ref 150–400)
RBC: 4.27 MIL/uL (ref 3.87–5.11)
RDW: 17.8 % — ABNORMAL HIGH (ref 11.5–15.5)
WBC: 6.8 10*3/uL (ref 4.0–10.5)
nRBC: 0 % (ref 0.0–0.2)

## 2018-12-27 LAB — COMPREHENSIVE METABOLIC PANEL
ALT: 40 U/L (ref 0–44)
AST: 36 U/L (ref 15–41)
Albumin: 3.4 g/dL — ABNORMAL LOW (ref 3.5–5.0)
Alkaline Phosphatase: 45 U/L (ref 38–126)
Anion gap: 8 (ref 5–15)
BUN: 13 mg/dL (ref 6–20)
CO2: 26 mmol/L (ref 22–32)
Calcium: 8.4 mg/dL — ABNORMAL LOW (ref 8.9–10.3)
Chloride: 104 mmol/L (ref 98–111)
Creatinine, Ser: 0.83 mg/dL (ref 0.44–1.00)
GFR calc Af Amer: >60 mL/min (ref >60)
GFR calc non Af Amer: >60 mL/min (ref >60)
Glucose, Bld: 131 mg/dL — ABNORMAL HIGH (ref 70–99)
Potassium: 3.6 mmol/L (ref 3.5–5.1)
Sodium: 138 mmol/L (ref 135–145)
Total Bilirubin: 0.6 mg/dL (ref 0.3–1.2)
Total Protein: 6.4 g/dL — ABNORMAL LOW (ref 6.5–8.1)

## 2018-12-27 LAB — URINALYSIS, ROUTINE W REFLEX MICROSCOPIC
Glucose, UA: NEGATIVE mg/dL
Ketones, ur: 20 mg/dL — AB
Nitrite: NEGATIVE
Protein, ur: 100 mg/dL — AB
Specific Gravity, Urine: 1.04 — ABNORMAL HIGH (ref 1.005–1.030)
pH: 5 (ref 5.0–8.0)

## 2018-12-27 LAB — I-STAT BETA HCG BLOOD, ED (MC, WL, AP ONLY): I-stat hCG, quantitative: <5 m[IU]/mL (ref <5)

## 2018-12-27 LAB — LIPASE, BLOOD: Lipase: 26 U/L (ref 11–51)

## 2018-12-27 MED ORDER — IOHEXOL 300 MG/ML  SOLN
100.0000 mL | Freq: Once | INTRAMUSCULAR | Status: AC | PRN
  Administered 2018-12-28: 100 mL via INTRAVENOUS

## 2018-12-27 MED ORDER — SODIUM CHLORIDE 0.9% FLUSH: 3.0000 mL | Freq: Once | INTRAVENOUS | Status: DC

## 2018-12-27 MED ORDER — ONDANSETRON HCL 4 MG/2ML IJ SOLN
4.0000 mg | Freq: Once | INTRAMUSCULAR | Status: AC
  Administered 2018-12-27: 4 mg via INTRAVENOUS
  Filled 2018-12-27: qty 2

## 2018-12-27 MED ORDER — FENTANYL CITRATE (PF) 100 MCG/2ML IJ SOLN
50.0000 ug | Freq: Once | INTRAMUSCULAR | Status: AC
  Administered 2018-12-27: 50 ug via INTRAVENOUS
  Filled 2018-12-27: qty 2

## 2018-12-27 MED ORDER — SODIUM CHLORIDE 0.9 % IV BOLUS (SEPSIS)
1000.0000 mL | Freq: Once | INTRAVENOUS | Status: AC
  Administered 2018-12-27: 1000 mL via INTRAVENOUS

## 2018-12-27 NOTE — ED Triage Notes (Signed)
Pt reports recently receiving an IUD on last Thursday. She went to her GYN today had an [place] that was negative, she continues to have abdominal pain and intermittent nausea. Pain is located in lower abdomen with right side pain.

## 2018-12-27 NOTE — ED Triage Notes (Addendum)
Pt c/o lower abd pain started last night-nausea started yesterday-pt had IUD inserted last week-seen at [facility] today with [place] "they didn't see anything"-NAD-steady gait-pt LWBS [place] earlier-states she had blood work drawn and gave urine

## 2018-12-28 ENCOUNTER — Encounter (HOSPITAL_COMMUNITY): Payer: Self-pay | Admitting: Surgery

## 2018-12-28 ENCOUNTER — Encounter (HOSPITAL_COMMUNITY)
Admission: EM | Disposition: A | Discharge status: Home / Self Care | Payer: Self-pay | Source: Home / Self Care | Attending: Emergency Medicine

## 2018-12-28 ENCOUNTER — Inpatient Hospital Stay (HOSPITAL_COMMUNITY): Payer: BC Managed Care – PPO | Admitting: Certified Registered Nurse Anesthetist

## 2018-12-28 DIAGNOSIS — K3580 Unspecified acute appendicitis: Secondary | ICD-10-CM | POA: Diagnosis present

## 2018-12-28 DIAGNOSIS — Z888 Allergy status to other drugs, medicaments and biological substances status: Secondary | ICD-10-CM | POA: Diagnosis not present

## 2018-12-28 DIAGNOSIS — Z20828 Contact with and (suspected) exposure to other viral communicable diseases: Secondary | ICD-10-CM | POA: Diagnosis not present

## 2018-12-28 DIAGNOSIS — R109 Unspecified abdominal pain: Secondary | ICD-10-CM | POA: Diagnosis present

## 2018-12-28 DIAGNOSIS — K469 Unspecified abdominal hernia without obstruction or gangrene: Secondary | ICD-10-CM | POA: Diagnosis not present

## 2018-12-28 DIAGNOSIS — Z6831 Body mass index (BMI) 31.0-31.9, adult: Secondary | ICD-10-CM | POA: Diagnosis not present

## 2018-12-28 DIAGNOSIS — Z975 Presence of (intrauterine) contraceptive device: Secondary | ICD-10-CM | POA: Diagnosis not present

## 2018-12-28 DIAGNOSIS — K562 Volvulus: Secondary | ICD-10-CM | POA: Diagnosis present

## 2018-12-28 DIAGNOSIS — Z881 Allergy status to other antibiotic agents status: Secondary | ICD-10-CM | POA: Diagnosis not present

## 2018-12-28 DIAGNOSIS — Z9884 Bariatric surgery status: Secondary | ICD-10-CM | POA: Diagnosis not present

## 2018-12-28 DIAGNOSIS — K660 Peritoneal adhesions (postprocedural) (postinfection): Secondary | ICD-10-CM | POA: Diagnosis not present

## 2018-12-28 DIAGNOSIS — E669 Obesity, unspecified: Secondary | ICD-10-CM | POA: Diagnosis not present

## 2018-12-28 HISTORY — PX: GASTRIC ROUX-EN-Y: SHX5262

## 2018-12-28 LAB — TYPE AND SCREEN
ABO/RH(D): A POS
Antibody Screen: NEGATIVE

## 2018-12-28 LAB — LACTIC ACID, PLASMA
Lactic Acid, Venous: 0.5 mmol/L (ref 0.5–1.9)
Lactic Acid, Venous: 0.6 mmol/L (ref 0.5–1.9)

## 2018-12-28 LAB — SARS CORONAVIRUS 2 (TAT 6-24 HRS): SARS Coronavirus 2: NEGATIVE

## 2018-12-28 LAB — SARS CORONAVIRUS 2 BY RT PCR (HOSPITAL ORDER, PERFORMED IN ~~LOC~~ HOSPITAL LAB): SARS Coronavirus 2: NEGATIVE

## 2018-12-28 LAB — HIV ANTIBODY (ROUTINE TESTING W REFLEX): HIV Screen 4th Generation wRfx: NONREACTIVE

## 2018-12-28 LAB — ABO/RH: ABO/RH(D): A POS

## 2018-12-28 SURGERY — LAPAROSCOPIC ROUX-EN-Y GASTRIC BYPASS WITH UPPER ENDOSCOPY
Anesthesia: General

## 2018-12-28 MED ORDER — FENTANYL CITRATE (PF) 100 MCG/2ML IJ SOLN
50.0000 ug | INTRAMUSCULAR | Status: DC | PRN
  Administered 2018-12-28: 50 ug via INTRAVENOUS
  Filled 2018-12-28: qty 2

## 2018-12-28 MED ORDER — DEXAMETHASONE SODIUM PHOSPHATE 10 MG/ML IJ SOLN
INTRAMUSCULAR | Status: DC | PRN
  Administered 2018-12-28: 10 mg via INTRAVENOUS

## 2018-12-28 MED ORDER — MIDAZOLAM HCL 2 MG/2ML IJ SOLN
INTRAMUSCULAR | Status: AC
  Filled 2018-12-28: qty 2

## 2018-12-28 MED ORDER — FENTANYL CITRATE (PF) 100 MCG/2ML IJ SOLN
100.0000 ug | Freq: Once | INTRAMUSCULAR | Status: AC
  Administered 2018-12-28: 100 ug via INTRAVENOUS
  Filled 2018-12-28: qty 2

## 2018-12-28 MED ORDER — PIPERACILLIN-TAZOBACTAM 3.375 G IVPB
3.3750 g | Freq: Three times a day (TID) | INTRAVENOUS | Status: AC
  Administered 2018-12-28: 3.375 g via INTRAVENOUS
  Filled 2018-12-28: qty 50

## 2018-12-28 MED ORDER — PIPERACILLIN-TAZOBACTAM 3.375 G IVPB
3.3750 g | Freq: Three times a day (TID) | INTRAVENOUS | Status: DC
  Filled 2018-12-28: qty 50

## 2018-12-28 MED ORDER — PHENYLEPHRINE HCL (PRESSORS) 10 MG/ML IV SOLN
INTRAVENOUS | Status: AC
  Filled 2018-12-28: qty 1

## 2018-12-28 MED ORDER — 0.9 % SODIUM CHLORIDE (POUR BTL) OPTIME
TOPICAL | Status: DC | PRN
  Administered 2018-12-28: 1000 mL

## 2018-12-28 MED ORDER — LACTATED RINGERS IV SOLN
INTRAVENOUS | Status: DC
  Administered 2018-12-28 (×2): via INTRAVENOUS

## 2018-12-28 MED ORDER — LIDOCAINE 2% (20 MG/ML) 5 ML SYRINGE
INTRAMUSCULAR | Status: AC
  Filled 2018-12-28: qty 5

## 2018-12-28 MED ORDER — PROPOFOL 10 MG/ML IV BOLUS
INTRAVENOUS | Status: DC | PRN
  Administered 2018-12-28: 200 mg via INTRAVENOUS

## 2018-12-28 MED ORDER — BUPIVACAINE HCL (PF) 0.25 % IJ SOLN
INTRAMUSCULAR | Status: DC | PRN
  Administered 2018-12-28: 30 mL

## 2018-12-28 MED ORDER — ROCURONIUM BROMIDE 10 MG/ML (PF) SYRINGE
PREFILLED_SYRINGE | INTRAVENOUS | Status: DC | PRN
  Administered 2018-12-28 (×2): 10 mg via INTRAVENOUS
  Administered 2018-12-28: 40 mg via INTRAVENOUS
  Administered 2018-12-28: 10 mg via INTRAVENOUS

## 2018-12-28 MED ORDER — ONDANSETRON HCL 4 MG/2ML IJ SOLN
INTRAMUSCULAR | Status: AC
  Filled 2018-12-28: qty 2

## 2018-12-28 MED ORDER — BUPIVACAINE HCL (PF) 0.25 % IJ SOLN
INTRAMUSCULAR | Status: AC
  Filled 2018-12-28: qty 30

## 2018-12-28 MED ORDER — SUCCINYLCHOLINE CHLORIDE 200 MG/10ML IV SOSY
PREFILLED_SYRINGE | INTRAVENOUS | Status: AC
  Filled 2018-12-28: qty 10

## 2018-12-28 MED ORDER — KETOROLAC TROMETHAMINE 30 MG/ML IJ SOLN: 30.0000 mg | Freq: Once | INTRAMUSCULAR | Status: DC | PRN

## 2018-12-28 MED ORDER — SODIUM CHLORIDE 0.9 % IV BOLUS (SEPSIS)
1000.0000 mL | Freq: Once | INTRAVENOUS | Status: AC
  Administered 2018-12-28: 1000 mL via INTRAVENOUS

## 2018-12-28 MED ORDER — PROMETHAZINE HCL 25 MG/ML IJ SOLN: 6.2500 mg | INTRAMUSCULAR | Status: DC | PRN

## 2018-12-28 MED ORDER — FENTANYL CITRATE (PF) 100 MCG/2ML IJ SOLN
INTRAMUSCULAR | Status: AC
  Filled 2018-12-28: qty 2

## 2018-12-28 MED ORDER — LIDOCAINE 2% (20 MG/ML) 5 ML SYRINGE
INTRAMUSCULAR | Status: DC | PRN
  Administered 2018-12-28: 50 mg via INTRAVENOUS
  Administered 2018-12-28: 80 mg via INTRAVENOUS

## 2018-12-28 MED ORDER — BUPIVACAINE LIPOSOME 1.3 % IJ SUSP
20.0000 mL | INTRAMUSCULAR | Status: AC
  Administered 2018-12-28: 20 mL
  Filled 2018-12-28: qty 20

## 2018-12-28 MED ORDER — PIPERACILLIN-TAZOBACTAM 3.375 G IVPB 30 MIN
3.3750 g | Freq: Once | INTRAVENOUS | Status: AC
  Administered 2018-12-28: 3.375 g via INTRAVENOUS
  Filled 2018-12-28: qty 50

## 2018-12-28 MED ORDER — PANTOPRAZOLE SODIUM 40 MG IV SOLR
40.0000 mg | Freq: Every day | INTRAVENOUS | Status: DC
  Administered 2018-12-28: 40 mg via INTRAVENOUS
  Filled 2018-12-28: qty 40

## 2018-12-28 MED ORDER — ACETAMINOPHEN 500 MG PO TABS
1000.0000 mg | ORAL_TABLET | Freq: Four times a day (QID) | ORAL | Status: DC
  Administered 2018-12-28 – 2018-12-29 (×3): 1000 mg via ORAL
  Filled 2018-12-28 (×3): qty 2

## 2018-12-28 MED ORDER — SUCCINYLCHOLINE CHLORIDE 200 MG/10ML IV SOSY
PREFILLED_SYRINGE | INTRAVENOUS | Status: DC | PRN
  Administered 2018-12-28: 140 mg via INTRAVENOUS

## 2018-12-28 MED ORDER — ALBUMIN HUMAN 5 % IV SOLN
INTRAVENOUS | Status: AC
  Filled 2018-12-28: qty 250

## 2018-12-28 MED ORDER — GABAPENTIN 300 MG PO CAPS
300.0000 mg | ORAL_CAPSULE | Freq: Two times a day (BID) | ORAL | Status: DC
  Administered 2018-12-28 – 2018-12-29 (×2): 300 mg via ORAL
  Filled 2018-12-28 (×2): qty 1

## 2018-12-28 MED ORDER — PROPOFOL 10 MG/ML IV BOLUS
INTRAVENOUS | Status: AC
  Filled 2018-12-28: qty 20

## 2018-12-28 MED ORDER — SIMETHICONE 80 MG PO CHEW
40.0000 mg | CHEWABLE_TABLET | Freq: Four times a day (QID) | ORAL | Status: DC | PRN
  Administered 2018-12-28 – 2018-12-29 (×3): 40 mg via ORAL
  Filled 2018-12-28 (×3): qty 1

## 2018-12-28 MED ORDER — ROCURONIUM BROMIDE 10 MG/ML (PF) SYRINGE
PREFILLED_SYRINGE | INTRAVENOUS | Status: AC
  Filled 2018-12-28: qty 20

## 2018-12-28 MED ORDER — MEPERIDINE HCL 50 MG/ML IJ SOLN: 6.2500 mg | INTRAMUSCULAR | Status: DC | PRN

## 2018-12-28 MED ORDER — FENTANYL CITRATE (PF) 100 MCG/2ML IJ SOLN
INTRAMUSCULAR | Status: DC | PRN
  Administered 2018-12-28 (×4): 50 ug via INTRAVENOUS

## 2018-12-28 MED ORDER — KCL IN DEXTROSE-NACL 20-5-0.45 MEQ/L-%-% IV SOLN
INTRAVENOUS | Status: DC
  Administered 2018-12-28 (×2): via INTRAVENOUS
  Filled 2018-12-28 (×4): qty 1000

## 2018-12-28 MED ORDER — ALBUMIN HUMAN 5 % IV SOLN
INTRAVENOUS | Status: DC | PRN
  Administered 2018-12-28: 11:00:00 via INTRAVENOUS

## 2018-12-28 MED ORDER — ONDANSETRON HCL 4 MG/2ML IJ SOLN
4.0000 mg | Freq: Four times a day (QID) | INTRAMUSCULAR | Status: DC | PRN
  Administered 2018-12-28 (×2): 4 mg via INTRAVENOUS
  Filled 2018-12-28: qty 2

## 2018-12-28 MED ORDER — DOCUSATE SODIUM 100 MG PO CAPS
100.0000 mg | ORAL_CAPSULE | Freq: Two times a day (BID) | ORAL | Status: DC
  Administered 2018-12-28 – 2018-12-29 (×2): 100 mg via ORAL
  Filled 2018-12-28 (×2): qty 1

## 2018-12-28 MED ORDER — ACETAMINOPHEN 325 MG PO TABS
650.0000 mg | ORAL_TABLET | Freq: Once | ORAL | Status: AC
  Administered 2018-12-28: 650 mg via ORAL
  Filled 2018-12-28: qty 2

## 2018-12-28 MED ORDER — ENOXAPARIN SODIUM 40 MG/0.4ML ~~LOC~~ SOLN
40.0000 mg | SUBCUTANEOUS | Status: DC
  Filled 2018-12-28: qty 0.4

## 2018-12-28 MED ORDER — METHOCARBAMOL 500 MG PO TABS: 500.0000 mg | ORAL_TABLET | Freq: Four times a day (QID) | ORAL | Status: DC | PRN

## 2018-12-28 MED ORDER — SUCCINYLCHOLINE CHLORIDE 200 MG/10ML IV SOSY
PREFILLED_SYRINGE | INTRAVENOUS | Status: AC
  Filled 2018-12-28: qty 20

## 2018-12-28 MED ORDER — HYDROMORPHONE HCL 1 MG/ML IJ SOLN: 0.2500 mg | INTRAMUSCULAR | Status: DC | PRN

## 2018-12-28 MED ORDER — ONDANSETRON 4 MG PO TBDP: 4.0000 mg | ORAL_TABLET | Freq: Four times a day (QID) | ORAL | Status: DC | PRN

## 2018-12-28 MED ORDER — MORPHINE SULFATE (PF) 2 MG/ML IV SOLN: 1.0000 mg | INTRAVENOUS | Status: DC | PRN

## 2018-12-28 MED ORDER — LIDOCAINE 2% (20 MG/ML) 5 ML SYRINGE
INTRAMUSCULAR | Status: AC
  Filled 2018-12-28: qty 10

## 2018-12-28 MED ORDER — SUGAMMADEX SODIUM 200 MG/2ML IV SOLN
INTRAVENOUS | Status: DC | PRN
  Administered 2018-12-28: 200 mg via INTRAVENOUS

## 2018-12-28 MED ORDER — MIDAZOLAM HCL 5 MG/5ML IJ SOLN
INTRAMUSCULAR | Status: DC | PRN
  Administered 2018-12-28: 2 mg via INTRAVENOUS

## 2018-12-28 SURGICAL SUPPLY — 133 items
ADH SKN CLS APL DERMABOND .7 (GAUZE/BANDAGES/DRESSINGS)
APL LAPSCP 35 DL APL RGD (MISCELLANEOUS)
APL PRP STRL LF DISP 70% ISPRP (MISCELLANEOUS) ×1
APL SKNCLS STERI-STRIP NONHPOA (GAUZE/BANDAGES/DRESSINGS)
APL SWBSTK 6 STRL LF DISP (MISCELLANEOUS)
APPLICATOR COTTON TIP 6 STRL (MISCELLANEOUS) IMPLANT
APPLICATOR COTTON TIP 6IN STRL (MISCELLANEOUS)
APPLICATOR VISTASEAL 35 (MISCELLANEOUS) ×1 IMPLANT
APPLIER CLIP 5 13 M/L LIGAMAX5 (MISCELLANEOUS)
APPLIER CLIP ROT 10 11.4 M/L (STAPLE)
APPLIER CLIP ROT 13.4 12 LRG (CLIP)
APR CLP LRG 13.4X12 ROT 20 MLT (CLIP)
APR CLP MED LRG 11.4X10 (STAPLE)
APR CLP MED LRG 5 ANG JAW (MISCELLANEOUS)
BENZOIN TINCTURE PRP APPL 2/3 (GAUZE/BANDAGES/DRESSINGS) IMPLANT
BLADE EXTENDED COATED 6.5IN (ELECTRODE) IMPLANT
BLADE SURG SZ10 CARB STEEL (BLADE) IMPLANT
BLADE SURG SZ11 CARB STEEL (BLADE) ×2 IMPLANT
BNDG ADH 1X3 SHEER STRL LF (GAUZE/BANDAGES/DRESSINGS) ×6 IMPLANT
BNDG ADH THN 3X1 STRL LF (GAUZE/BANDAGES/DRESSINGS)
CABLE HIGH FREQUENCY MONO STRZ (ELECTRODE) ×2 IMPLANT
CELLS DAT CNTRL 66122 CELL SVR (MISCELLANEOUS) IMPLANT
CHLORAPREP W/TINT 26 (MISCELLANEOUS) ×4 IMPLANT
CLIP APPLIE 5 13 M/L LIGAMAX5 (MISCELLANEOUS) IMPLANT
CLIP APPLIE ROT 10 11.4 M/L (STAPLE) IMPLANT
CLIP APPLIE ROT 13.4 12 LRG (CLIP) IMPLANT
CLIP SUT LAPRA TY ABSORB (SUTURE) ×2 IMPLANT
COVER MAYO STAND STRL (DRAPES) IMPLANT
COVER SURGICAL LIGHT HANDLE (MISCELLANEOUS) ×2 IMPLANT
COVER WAND RF STERILE (DRAPES) IMPLANT
CUTTER FLEX LINEAR 45M (STAPLE) ×1 IMPLANT
DECANTER SPIKE VIAL GLASS SM (MISCELLANEOUS) ×2 IMPLANT
DERMABOND ADVANCED (GAUZE/BANDAGES/DRESSINGS)
DERMABOND ADVANCED .7 DNX12 (GAUZE/BANDAGES/DRESSINGS) IMPLANT
DEVICE SUT QUICK LOAD TK 5 (STAPLE) IMPLANT
DEVICE SUT TI-KNOT TK 5X26 (MISCELLANEOUS) IMPLANT
DEVICE SUTURE ENDOST 10MM (ENDOMECHANICALS) ×2 IMPLANT
DRAIN PENROSE 18X1/4 LTX STRL (WOUND CARE) ×1 IMPLANT
DRAPE SHEET LG 3/4 BI-LAMINATE (DRAPES) IMPLANT
DRAPE WARM FLUID 44X44 (DRAPES) IMPLANT
ELECT PENCIL ROCKER SW 15FT (MISCELLANEOUS) IMPLANT
ELECT REM PT RETURN 15FT ADLT (MISCELLANEOUS) ×2 IMPLANT
GAUZE 4X4 16PLY RFD (DISPOSABLE) ×2 IMPLANT
GAUZE SPONGE 4X4 12PLY STRL (GAUZE/BANDAGES/DRESSINGS) ×1 IMPLANT
GLOVE BIO SURGEON STRL SZ7.5 (GLOVE) IMPLANT
GLOVE BIOGEL M STRL SZ7.5 (GLOVE) ×2 IMPLANT
GLOVE INDICATOR 8.0 STRL GRN (GLOVE) ×2 IMPLANT
GOWN STRL REUS W/TWL XL LVL3 (GOWN DISPOSABLE) ×11 IMPLANT
GRASPER SUT TROCAR 14GX15 (MISCELLANEOUS) ×1 IMPLANT
HANDLE SUCTION POOLE (INSTRUMENTS) IMPLANT
HOVERMATT SINGLE USE (MISCELLANEOUS) ×1 IMPLANT
IRRIG SUCT STRYKERFLOW 2 WTIP (MISCELLANEOUS)
IRRIGATION SUCT STRKRFLW 2 WTP (MISCELLANEOUS) IMPLANT
KIT BASIN OR (CUSTOM PROCEDURE TRAY) ×2 IMPLANT
KIT GASTRIC LAVAGE 34FR ADT (SET/KITS/TRAYS/PACK) ×1 IMPLANT
KIT TURNOVER KIT A (KITS) IMPLANT
LEGGING LITHOTOMY PAIR STRL (DRAPES) IMPLANT
MARKER SKIN DUAL TIP RULER LAB (MISCELLANEOUS) ×1 IMPLANT
NDL SPNL 22GX3.5 QUINCKE BK (NEEDLE) ×1 IMPLANT
NEEDLE HYPO 22GX1.5 SAFETY (NEEDLE) ×1 IMPLANT
NEEDLE SPNL 22GX3.5 QUINCKE BK (NEEDLE) IMPLANT
PACK CARDIOVASCULAR III (CUSTOM PROCEDURE TRAY) ×2 IMPLANT
PENCIL SMOKE EVACUATOR (MISCELLANEOUS) IMPLANT
RELOAD 45 VASCULAR/THIN (ENDOMECHANICALS) ×2 IMPLANT
RELOAD ENDO STITCH 2.0 (ENDOMECHANICALS)
RELOAD STAPLE 45 2.5 WHT GRN (ENDOMECHANICALS) IMPLANT
RELOAD STAPLE 60 2.6 WHT THN (STAPLE) ×2 IMPLANT
RELOAD STAPLE 60 3.6 BLU REG (STAPLE) ×2 IMPLANT
RELOAD STAPLE 60 3.8 GOLD REG (STAPLE) ×1 IMPLANT
RELOAD STAPLE TA45 3.5 REG BLU (ENDOMECHANICALS) IMPLANT
RELOAD STAPLER BLUE 60MM (STAPLE) IMPLANT
RELOAD STAPLER GOLD 60MM (STAPLE) IMPLANT
RELOAD STAPLER WHITE 60MM (STAPLE) IMPLANT
RELOAD SUT SNGL STCH ABSRB 2-0 (ENDOMECHANICALS) ×4 IMPLANT
RELOAD SUT SNGL STCH BLK 2-0 (ENDOMECHANICALS) ×4 IMPLANT
RETRACTOR WND ALEXIS 18 MED (MISCELLANEOUS) IMPLANT
RTRCTR WOUND ALEXIS 18CM MED (MISCELLANEOUS)
SCISSORS LAP 5X35 DISP (ENDOMECHANICALS) ×2 IMPLANT
SCISSORS LAP 5X45 EPIX DISP (ENDOMECHANICALS) ×1 IMPLANT
SET IRRIG TUBING LAPAROSCOPIC (IRRIGATION / IRRIGATOR) ×2 IMPLANT
SET TUBE SMOKE EVAC HIGH FLOW (TUBING) ×2 IMPLANT
SHEARS HARMONIC ACE PLUS 36CM (ENDOMECHANICALS) IMPLANT
SHEARS HARMONIC ACE PLUS 45CM (MISCELLANEOUS) ×2 IMPLANT
SLEEVE XCEL OPT CAN 5 100 (ENDOMECHANICALS) ×4 IMPLANT
SOL ANTI FOG 6CC (MISCELLANEOUS) ×1 IMPLANT
SOLUTION ANTI FOG 6CC (MISCELLANEOUS) ×1
SPONGE LAP 18X18 RF (DISPOSABLE) IMPLANT
STAPLER ECHELON BIOABSB 60 FLE (MISCELLANEOUS) IMPLANT
STAPLER ECHELON LONG 60 440 (INSTRUMENTS) IMPLANT
STAPLER RELOAD BLUE 60MM (STAPLE)
STAPLER RELOAD GOLD 60MM (STAPLE)
STAPLER RELOAD WHITE 60MM (STAPLE)
STAPLER VISISTAT 35W (STAPLE) IMPLANT
STRIP CLOSURE SKIN 1/2X4 (GAUZE/BANDAGES/DRESSINGS) ×1 IMPLANT
SUCTION POOLE HANDLE (INSTRUMENTS)
SURGILUBE 2OZ TUBE FLIPTOP (MISCELLANEOUS) IMPLANT
SUT MNCRL AB 4-0 PS2 18 (SUTURE) ×4 IMPLANT
SUT PDS AB 1 TP1 96 (SUTURE) IMPLANT
SUT PROLENE 2 0 KS (SUTURE) IMPLANT
SUT PROLENE 2 0 SH DA (SUTURE) IMPLANT
SUT RELOAD ENDO STITCH 2 48X1 (ENDOMECHANICALS)
SUT RELOAD ENDO STITCH 2.0 (ENDOMECHANICALS)
SUT SILK 2 0 (SUTURE)
SUT SILK 2 0 SH CR/8 (SUTURE) IMPLANT
SUT SILK 2-0 18XBRD TIE 12 (SUTURE) IMPLANT
SUT SILK 3 0 (SUTURE)
SUT SILK 3 0 SH CR/8 (SUTURE) IMPLANT
SUT SILK 3-0 18XBRD TIE 12 (SUTURE) IMPLANT
SUT SURGIDAC NAB ES-9 0 48 120 (SUTURE) IMPLANT
SUT VIC AB 2-0 SH 27 (SUTURE)
SUT VIC AB 2-0 SH 27X BRD (SUTURE) ×1 IMPLANT
SUT VICRYL 0 UR6 27IN ABS (SUTURE) ×4 IMPLANT
SUTURE RELOAD END STTCH 2 48X1 (ENDOMECHANICALS) IMPLANT
SUTURE RELOAD ENDO STITCH 2.0 (ENDOMECHANICALS) IMPLANT
SYR 10ML ECCENTRIC (SYRINGE) IMPLANT
SYR 20ML LL LF (SYRINGE) IMPLANT
SYR BULB IRRIGATION 50ML (SYRINGE) IMPLANT
SYR CONTROL 10ML LL (SYRINGE) ×1 IMPLANT
SYS LAPSCP GELPORT 120MM (MISCELLANEOUS)
SYSTEM LAPSCP GELPORT 120MM (MISCELLANEOUS) IMPLANT
TOWEL OR 17X26 10 PK STRL BLUE (TOWEL DISPOSABLE) ×3 IMPLANT
TOWEL OR NON WOVEN STRL DISP B (DISPOSABLE) ×3 IMPLANT
TRAY FOLEY MTR SLVR 14FR STAT (SET/KITS/TRAYS/PACK) ×1 IMPLANT
TRAY FOLEY MTR SLVR 16FR STAT (SET/KITS/TRAYS/PACK) ×1 IMPLANT
TRAY LAPAROSCOPIC (CUSTOM PROCEDURE TRAY) ×1 IMPLANT
TROCAR BLADELESS OPT 5 100 (ENDOMECHANICALS) ×3 IMPLANT
TROCAR UNIVERSAL OPT 12M 100M (ENDOMECHANICALS) ×3 IMPLANT
TROCAR XCEL 12X100 BLDLESS (ENDOMECHANICALS) ×1 IMPLANT
TROCAR XCEL BLUNT TIP 100MML (ENDOMECHANICALS) ×1 IMPLANT
TROCAR XCEL NON-BLD 11X100MML (ENDOMECHANICALS) IMPLANT
TUBING CONNECTING 10 (TUBING) ×2 IMPLANT
YANKAUER SUCT BULB TIP 10FT TU (MISCELLANEOUS) IMPLANT
YANKAUER SUCT BULB TIP NO VENT (SUCTIONS) IMPLANT

## 2018-12-28 NOTE — Op Note (Signed)
12/28/2018  2:07 PM  PATIENT:  Karen Mcintyre  28 y.o. female  PRE-OPERATIVE DIAGNOSIS:  bowel obstruction;  history of gastric bypass  POST-OPERATIVE DIAGNOSIS:  Acute appendicitis, Internal Hernia; history of laparoscopic roux en y gastric bypass 2015  PROCEDURE:  Procedure(s): DIAGNOSTIC LAPAROSCOPY, LYSIS OF ADHESIONS, LAPAROSCOPIC APPENDECTOMY; laparoscopic bilateral tap block  SURGEON:  Surgeon(s): Greer Pickerel, MD   ASSISTANTS: Kinsinger, Arta Bruce, MD; Byrd Hesselbach RNFA   ANESTHESIA:   general  DRAINS: none  LOCAL MEDICATIONS USED:  MARCAINE    and OTHER exparel  SPECIMEN:  Source of Specimen:  appendix  DISPOSITION OF SPECIMEN:  PATHOLOGY  COUNTS:  YES  INDICATION FOR PROCEDURE: 14-year-old female who underwent laparoscopic Roux-en-Y gastric bypass surgery in 2015 he was doing quite well with maintaining her weight loss scented to emergency room last evening complaining of acute onset of lower abdominal pain both in the right and mid and lower abdomen.  She was found to have normal labs.  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  She was transferred to [facility] long emergency room and evaluated by my on-call partner.  She had a normal white count and a normal lactate.  She was tender on exam.  I was alerted to her presence and reviewed her CT scan and story.  Her CT was concerning for some type of internal hernia not necessarily a classic bariatric internal hernia per se.  But she had bowel wall thickening in her biliopancreatic limb so I recommended diagnostic laparoscopy with possible exploratory surgery.  We had discussed risk and benefits which are separately documented.  PROCEDURE: After obtaining informed consent the patient was taken to the OR for at [facility] long hospital placed supine on the operating table.  General endotracheal anesthesia was established.  Sequential compression devices were placed.  Her arms were tucked at her side with the appropriate  padding after Foley catheter was placed.  Her abdomen was prepped and draped in the usual standard surgical fashion.  She had recently given birth 2 months ago as well as had a significant amount of weight loss so I prefer to gain access using Hassan technique in these situations.  A surgical timeout was performed.  She received IV antibiotic prior to skin incision.  A small infraumbilical incision was made through an old scar.  Fascia was grasped and the fascia was incised with a 11 blade.  Abdominal cavity was entered.  A pursestring suture of 0 Vicryl on a UR 6 needle was placed around the fascial edge.  A 12 mm Hassan trocar was placed and pneumoperitoneum was smoothly established up to a patient pressure of 15 mmHg.  The laparoscope was advanced and the abdominal cavity was surveilled.  There were no adhesions to the anterior abdominal wall.  There was obvious small bowel that appeared congested and hyperemic but viable.  Additional trochars were placed in the left upper quadrant and left lateral abdominal wall under direct visualization.  We did place a bilateral Exparel Marcaine tap block under laparoscopic guidance.  I started running her bowel at the cecum.  Her cecum was in the right midabdomen.  The appendix was visualized and there was inflammatory exudate around the appendix and the appendix tip and body was thickened and inflamed consistent with acute appendicitis.  There is no sign of perforation.  Identified her terminal ileum and started running the bowel back proximally using atraumatic bowel graspers.  Dr. Kieth Brightly was present and scrubbed.  We ran  her bowel back to the jejunojejunostomy.  It had some chronic dilatation which is normal.  There was no evidence of a mesenteric defect at the jejunojejunostomy.  We identified the biliopancreatic limb and ran it back to the ligament of Treitz.  It was normal.  We reidentified the jejunojejunostomy and started running back the Roux limb.  It became  obvious that there was a bowel herniated underneath the Roux limb anterior to the transverse colon.  The Roux limb was continued to be ran proximally as it went over the transverse colon.  It appeared that the mesentery of the Roux limb was twisted upon itself.  We identified the left lobe liver and lifted up slightly and identified some adhesions between the Roux limb and the undersurface of the left lobe of the liver.  There is also some adhesions between the jejunojejunostomy and the mid Roux limb which were thin and filmy.  At this point it appeared that the Roux limb was twisted on itself probably about 15 to 20 cm from the gastrojejunostomy.  I took down some thin adhesions between the jejunojejunostomy and the Roux limb thinking and hoping that that would cause the Roux limb to untwist itself.  This was done mainly with EndoShears with electrocautery.  While it separated the jejunojejunostomy from the Roux limb more so I did not alleviate the twist in the Roux limb.  We placed a Nathanson liver retractor and took down the adhesions of the Roux limb to the undersurface the left lobe of the liver this was done generally without cautery on the EndoShears.  This point Dr. Doylene Canard and Dr. Daphine Deutscher had joined [place] in the operating room.  It appeared that her Roux limb was twisted on itself but in her previous upper GI this appeared to be a chronic finding and not an acute issue.  Since she had active infection in her abdomen I did not feel that it was safe to proceed with revision of her gastrojejunostomy during this procedure given the fact that she had acute appendicitis.  Everyone felt that we would proceed with appendectomy let the patient recover and then see how she does and then discuss potential revision of her gastrojejunostomy  The cecum was re-visualized the terminal ileum was reidentified.  The appendix was grasped and lifted up.  A small window was made in the mesoappendix at its base with the [facility].  I then took down the mesoappendix in sequential fashion using harmonic scalpel.  The base of the appendix was then stapled with a Ethicon laparoscopic 45 stapler with a white load.  There was no appendiceal stump left.  The appendix was placed within an Ecco sac.  In the right lower quadrant was irrigated.  There was no bleeding from the mesoappendix or the staple line.  The specimen and the specimen bag was removed from the abdominal cavity.  The pursestring suture was tied down thus putting the fascial defect.  I did place an additional interrupted fascial suture using the PMI suture passer with laparoscopic guidance.  Remaining local was infiltrated around the umbilical closure.  The bowel had pinked up.  There is no sign of bowel compromise.  Trochars and the [facility] liver retractor had been removed.  Skin incisions were closed with a 4 Monocryl in a subcuticular fashion with the assistance of Fletcher Anon, RNFA.  Sterile dressings were applied.  Foley cath was moved.  Patient was extubated taken recovery room in stable condition.  All needle instrument  sponge counts were correct x2  PLAN OF CARE: Admit for overnight observation  PATIENT DISPOSITION:  PACU - hemodynamically stable.   Delay start of Pharmacological VTE agent (>24hrs) due to surgical blood loss or risk of bleeding:  no  Mary SellaEric M. [name], MD, FACS General, Bariatric, & Minimally Invasive Surgery [facility] [name]

## 2018-12-28 NOTE — ED Provider Notes (Signed)
[facility] EMERGENCY DEPARTMENT Provider Note   CSN: [id number] Arrival date & time: 12/27/18  1953     History   Chief Complaint Chief Complaint  Patient presents with  . Abdominal Pain    HPI Karen Mcintyre is a 28 y.o. female.     The history is provided by the patient.  Abdominal Pain Pain location:  RLQ Pain quality: aching   Pain radiates to:  LLQ Pain severity:  Moderate Onset quality:  Gradual Duration:  1 day Timing:  Constant Progression:  Worsening Chronicity:  New Relieved by:  Nothing Worsened by:  Movement and palpation Associated symptoms: fever, nausea and vaginal discharge   Associated symptoms: no chest pain, no cough, no diarrhea, no dysuria, no shortness of breath, no vaginal bleeding and no vomiting   Patient presents for abdominal pain.  Patient had an IUD placed last week and since then has had nausea.  Over the past 24 hours she has had abdominal pain mostly in the lower quadrants.  She also reports fever. She also reports she seen by her OB/GYN earlier today, reports she had a transvaginal ultrasound that was negative.  Since that time her pain is worsened.  She does report vaginal discharge but no bleeding at this time She is not breast-feeding   Patient has had gastric bypass before Past Medical History:  Diagnosis Date  . Epigastric pain 03-2014   Possible Internal Hernia  . Medical history non-contributory   . Obesity   . Obesity     Patient Active Problem List   Diagnosis Date Noted  . Maternal anemia, with delivery 10/28/2018  . SVD (spontaneous vaginal delivery) 10/26/2018  . Postpartum care following vaginal delivery (8/11) 10/26/2018  . Second degree perineal laceration 10/26/2018  . Antepartum bleeding, third trimester 09/10/2018  . Internal hernia 04/12/2014  . 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 ASCUS (atypical squamous cells of undetermined significance) on Pap smear 02/23/2012     Past Surgical History:  Procedure Laterality Date  . 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 LAPAROSCOPIC INTERNAL HERNIA REPAIR N/A 04/12/2014   Procedure: REDUCTION OF INTERNAL HERNIA REPAIR;  Surgeon: Atilano InaEric M Wilson, MD;  Location: [facility];  Service: General;  Laterality: N/A;  . LAPAROSCOPIC LYSIS OF ADHESIONS  04/12/2014   Procedure: LAPAROSCOPIC LYSIS OF ADHESIONS;  Surgeon: Atilano InaEric M Wilson, MD;  Location: [facility];  Service: General;;  . LAPAROSCOPY N/A 04/12/2014   Procedure: LAPAROSCOPY DIAGNOSTIC;  Surgeon: Atilano InaEric M Wilson, MD;  Location: [facility];  Service: General;  Laterality: N/A;  . wisdom teeth extraction       OB History    Gravida  3   Para  1   Term  1   Preterm  0   AB  2   Living  1     SAB  2   TAB  0   Ectopic  0   Multiple  0   Live Births  1        Obstetric Comments  2020 started as twin gestation, only one developed         Home Medications    Prior to Admission medications   Medication Sig Start Date End Date Taking? Authorizing Provider  acetaminophen (TYLENOL) 325 MG tablet Take 2 tablets (650  mg total) by mouth every 4 (four) hours as needed (for pain scale < 4). 10/28/18   Neta Mends, CNM  benzocaine-Menthol (DERMOPLAST) 20-0.5 % AERO Apply 1 application topically as needed for irritation (perineal discomfort). 10/28/18   Neta Mends, CNM  calcium-vitamin D (OSCAL WITH D) 500-200 MG-UNIT tablet Take 1 tablet by mouth.    [provider]  ibuprofen (ADVIL) 600 MG tablet Take 1 tablet (600 mg total) by mouth every 6 (six) hours. 10/28/18   Neta Mends, CNM  Prenatal Vit-Fe Fumarate-FA (PRENATAL MULTIVITAMIN) TABS tablet Take 1 tablet by mouth daily at 12 noon.    [provider]  vitamin B-12 (CYANOCOBALAMIN) 100 MCG tablet  Take 100 mcg by mouth daily. Reported on 03/20/2015    [provider]    Family History Family History  Problem Relation Age of Onset  . Hypertension Mother   . Alcohol abuse Father   . Cancer Paternal Grandmother        lung cancer  . Obesity Other     Social History Social History   Tobacco Use  . Smoking status: Never Smoker  . Smokeless tobacco: Never Used  Substance Use Topics  . Alcohol use: No  . Drug use: No     Allergies   Diphenhydramine hcl and Nitrofurantoin   Review of Systems Review of Systems  Constitutional: Positive for fever.  Respiratory: Negative for cough and shortness of breath.   Cardiovascular: Negative for chest pain.  Gastrointestinal: Positive for abdominal pain and nausea. Negative for diarrhea and vomiting.  Genitourinary: Positive for vaginal discharge. Negative for dysuria and vaginal bleeding.  All other systems reviewed and are negative.    Physical Exam Updated Vital Signs BP 119/76 (BP Location: Right Arm)   Pulse 96   Temp 100 F (37.8 C) (Oral)   Resp 18   SpO2 96%   Physical Exam CONSTITUTIONAL: Well developed/well nourished HEAD: Normocephalic/atraumatic EYES: EOMI/PERRL ENMT: Mask in place NECK: supple no meningeal signs SPINE/BACK:entire spine nontender CV: S1/S2 noted, no murmurs/rubs/gallops noted LUNGS: Lungs are clear to auscultation bilaterally, no apparent distress ABDOMEN: soft, moderate bilateral lower quadrant tenderness, no rebound or guarding, bowel sounds noted throughout abdomen GU:no cva tenderness NEURO: Pt is awake/alert/appropriate, moves all extremitiesx4.  No facial droop.   EXTREMITIES: pulses normal/equal, full ROM SKIN: warm, color normal PSYCH: no abnormalities of mood noted, alert and oriented to situation   ED Treatments / Results  Labs (all labs ordered are listed, but only abnormal results are displayed) Labs Reviewed  SARS CORONAVIRUS 2 (TAT 6-24 HRS)    EKG None   Radiology No results found.  Procedures .Critical Care Performed by: Zadie Rhine, MD Authorized by: Zadie Rhine, MD   Critical care provider statement:    Critical care time (minutes):  40   Critical care start time:  12/28/2018 1:00 AM   Critical care end time:  12/28/2018 1:40 AM   Critical care was necessary to treat or prevent imminent or life-threatening deterioration of the following conditions:  Sepsis   Critical care was time spent personally by me on the following activities:  Re-evaluation of patient's condition, pulse oximetry, ordering and review of laboratory studies, ordering and review of radiographic studies, discussions with consultants, evaluation of patient's response to treatment, examination of patient, review of old charts and ordering and performing treatments and interventions   I assumed direction of critical care for this patient from another provider in my specialty: no  Medications Ordered in ED Medications  sodium chloride 0.9 % bolus 1,000 mL (has no administration in time range)  sodium chloride 0.9 % bolus 1,000 mL (1,000 mLs Intravenous New Bag/Given 12/27/18 2350)  ondansetron (ZOFRAN) injection 4 mg (4 mg Intravenous Given 12/27/18 2334)  fentaNYL (SUBLIMAZE) injection 50 mcg (50 mcg Intravenous Given 12/27/18 2334)  iohexol (OMNIPAQUE) 300 MG/ML solution 100 mL (100 mLs Intravenous Contrast Given 12/28/18 0024)  fentaNYL (SUBLIMAZE) injection 100 mcg (100 mcg Intravenous Given 12/28/18 0123)     Initial Impression / Assessment and Plan / ED Course  I have reviewed the triage vital signs and the nursing notes.  Pertinent labs & imaging results that were available during my care of the patient were reviewed by me and considered in my medical decision making (see chart for details).        12:06 AM Labs from previous ED visit reviewed.  Will not repeat this time.  She will require CT imaging for further evaluation. She reports  she already had a negative TV ultrasound at her OB/GYN's office 1:36 AM Discussed CT findings with radiology.  They are concerned about volvulus. Will consult surgery 1:51 AM D/w Dr Dot Lanes He requests fluid resuscitation.  No antibiotics.  Transfer [facility]. Discussed with Dr. Tacey Ruiz long ER who accepts the patient in transfer Patient updated on plan. 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

## 2018-12-28 NOTE — Interval H&P Note (Signed)
History and Physical Interval Note:  12/28/2018 7:46 AM  Karen Mcintyre  has presented today for surgery, with the diagnosis of bowel obstruction history of gastric bypass.  The various methods of treatment have been discussed with the patient and family. After consideration of risks, benefits and other options for treatment, the patient has consented to  Procedure(s): DIAGNOSTIC LAPAROSCOPY POSSIBLE EXPLORATOY LAPAROTOMY (N/A) as a surgical intervention.  The patient's history has been reviewed, patient examined, no change in status, stable for surgery.  I have reviewed the patient's chart and labs.  Questions were answered to the patient's satisfaction.    History same as what Dr. Harlow Asa reported below.  Acute onset of abdominal pain yesterday.  Had been doing quite well.  Normal pregnancy and delivered 2 months ago.  Occasional doses of ibuprofen since delivery.  Has been compliant with her supplements.  Alert, nontoxic, no apparent distress Mild tachycardia Soft, abdominal laxity given recent pregnancy, tender to palpation in lower abdomen; involuntary guarding  I personally reviewed her CT scan  It is concerning for some type of possible internal hernia.  Her white blood cell count and lactate are normal.  Nonetheless I do think she needs diagnostic laparoscopy possible exploratory laparotomy.  Her COVID test is still pending.  I think because she has a normal white blood cell count, normal lactate exam and her abdominal exam is unchanged from my partners exam earlier this morning we can await her COVID results  Discussed risk and benefits of the procedure including but not limited to bleeding, infection, injury to surrounding structures, blood clot formation, need to convert to an open procedure, incisional hernia, possible need for bowel resection with inherent risk of ischemia, stricture or anastomotic leak.  Discussed the typical hospitalization as well as typical recovery.  Discussed  potential perioperative such as heart and lung events.  All of her questions were asked and answered.  Husband at bedside  Leighton Ruff. Redmond Pulling, MD, FACS General, Bariatric, & Minimally Invasive Surgery [facility]   Greer Pickerel

## 2018-12-28 NOTE — Anesthesia Postprocedure Evaluation (Signed)
Anesthesia Post Note  Patient: Karen Mcintyre  Procedure(s) Performed: DIAGNOSTIC LAPAROSCOPY, LYSIS OF ADHESIONS, LAPAROSCOPIC APPENDECTOMY (N/A )     Patient location during evaluation: PACU Anesthesia Type: General Level of consciousness: sedated Pain management: pain level controlled Vital Signs Assessment: post-procedure vital signs reviewed and stable Respiratory status: spontaneous breathing Cardiovascular status: stable Postop Assessment: no apparent nausea or vomiting Anesthetic complications: no    Last Vitals:  Vitals:   12/28/18 1300 12/28/18 1315  BP: 108/69 110/65  Pulse: 71 80  Resp: 18 20  Temp:    SpO2: 100% 100%    Last Pain:  Vitals:   12/28/18 1300  TempSrc:   PainSc: Asleep   Pain Goal:                   Huston Foley

## 2018-12-28 NOTE — ED Notes (Signed)
CC surgery paged about patient's fever, waiting on response to see if Tylenol can be given.

## 2018-12-28 NOTE — ED Notes (Signed)
Report given to Short Stay. Needs type and screen before going.

## 2018-12-28 NOTE — ED Notes (Signed)
Surgeon stated that with the pt's increase in temperature and HR, that she would probably go to sx this AM, concern for 6-24 hour COVID swab to return, did a rapid as well.

## 2018-12-28 NOTE — ED Provider Notes (Signed)
Patient presents from [place].  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  On my evaluation, patient reports that she is comfortable after pain medication.  She rates her pain at 5 out of 10.  Is worse with certain movements.  She is not having any nausea and vomiting.  She does report dry mouth.    General surgery paged regarding patient arrival.  Patient to be admitted by General Surgery.   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

## 2018-12-28 NOTE — Transfer of Care (Signed)
Immediate Anesthesia Transfer of Care Note  Patient: Karen Mcintyre  Procedure(s) Performed: Procedure(s): DIAGNOSTIC LAPAROSCOPY, LYSIS OF ADHESIONS, LAPAROSCOPIC APPENDECTOMY (N/A)  Patient Location: PACU  Anesthesia Type:General  Level of Consciousness:  sedated, patient cooperative and responds to stimulation  Airway & Oxygen Therapy:Patient Spontanous Breathing and Patient connected to face mask oxgen  Post-op Assessment:  Report given to PACU RN and Post -op Vital signs reviewed and stable  Post vital signs:  Reviewed and stable  Last Vitals:  Vitals:   12/28/18 0900 12/28/18 0919  BP: 102/60 109/60  Pulse: 77 73  Resp: 17 18  Temp:  37 C  SpO2: 49% 44%    Complications: No apparent anesthesia complications

## 2018-12-28 NOTE — Anesthesia Preprocedure Evaluation (Signed)
Anesthesia Evaluation  Patient identified by MRN, date of birth, ID band Patient awake    Reviewed: Allergy & Precautions, NPO status , Patient's Chart, lab work & pertinent test results  Airway Mallampati: I       Dental no notable dental hx. (+) Teeth Intact   Pulmonary neg pulmonary ROS,    Pulmonary exam normal breath sounds clear to auscultation       Cardiovascular negative cardio ROS Normal cardiovascular exam Rhythm:Regular Rate:Normal     Neuro/Psych negative neurological ROS  negative psych ROS   GI/Hepatic negative GI ROS, Neg liver ROS,   Endo/Other  negative endocrine ROS  Renal/GU negative Renal ROS  negative genitourinary   Musculoskeletal   Abdominal (+) + obese,   Peds  Hematology  (+) Blood dyscrasia, anemia ,   Anesthesia Other Findings   Reproductive/Obstetrics                             Anesthesia Physical Anesthesia Plan  ASA: II  Anesthesia Plan: General   Post-op Pain Management:    Induction: Intravenous  PONV Risk Score and Plan: 4 or greater and Ondansetron and Dexamethasone  Airway Management Planned: Oral ETT  Additional Equipment: None  Intra-op Plan:   Post-operative Plan:   Informed Consent: I have reviewed the patients History and Physical, chart, labs and discussed the procedure including the risks, benefits and alternatives for the proposed anesthesia with the patient or authorized representative who has indicated his/her understanding and acceptance.     Dental advisory given  Plan Discussed with: CRNA  Anesthesia Plan Comments:         Anesthesia Quick Evaluation

## 2018-12-28 NOTE — Anesthesia Procedure Notes (Signed)
Procedure Name: Intubation Date/Time: 12/28/2018 10:43 AM Performed by: Lavina Hamman, CRNA Pre-anesthesia Checklist: Patient identified, Emergency Drugs available, Suction available, Patient being monitored and Timeout performed Patient Re-evaluated:Patient Re-evaluated prior to induction Oxygen Delivery Method: Circle system utilized Preoxygenation: Pre-oxygenation with 100% oxygen Induction Type: IV induction Ventilation: Mask ventilation without difficulty Laryngoscope Size: Mac and 3 Grade View: Grade I Tube type: Oral Tube size: 7.5 mm Number of attempts: 1 Airway Equipment and Method: Stylet Placement Confirmation: ETT inserted through vocal cords under direct vision,  positive ETCO2,  CO2 detector and breath sounds checked- equal and bilateral Secured at: 21 cm Tube secured with: Tape Dental Injury: Teeth and Oropharynx as per pre-operative assessment

## 2018-12-28 NOTE — H&P (Signed)
Karen Mcintyre is an 28 y.o. female.    General Surgery [facility]  Chief Complaint: abdominal pain, hx of gastric bypass procedure  HPI: Patient is a 28 year old female who underwent Roux-en-Y gastric bypass by Dr. Greer Pickerel in May 2015.  She required laparoscopy with reduction of internal hernia and lysis of adhesions in January 2016 also by Dr. Greer Pickerel.  She has done well since that time.  She delivered a child 2 months ago.  Yesterday she developed lower abdominal discomfort.  She initially felt this might be related to the recent placement of an IUD by her gynecologist.  She was seen and evaluated yesterday morning by her gynecologist including a pelvic ultrasound.  Pain persisted and the patient presented to [place] for evaluation.  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  Patient is therefore transferred to [facility] long for admission and evaluation by the bariatric surgical team.  Patient denies any nausea or vomiting.  She has been having normal bowel movements.  She is passing flatus.  Patient is accompanied by her husband.  Past Medical History:  Diagnosis Date  . Epigastric pain 03-2014   Possible Internal Hernia  . Medical history non-contributory   . Obesity   . Obesity     Past Surgical History:  Procedure Laterality Date  . GASTRIC ROUX-EN-Y N/A 08/01/2013   Procedure: LAPAROSCOPIC ROUX-EN-Y GASTRIC BYPASS WITH UPPER ENDOSCOPY WITH LAPAROSCOPIC REPAIR OF HIATAL HERNIA;  Surgeon: Gayland Curry, MD;  Location: [facility];  Service: General;  Laterality: N/A;  . LAPAROSCOPIC INTERNAL HERNIA REPAIR N/A 04/12/2014   Procedure: REDUCTION OF INTERNAL HERNIA REPAIR;  Surgeon: Gayland Curry, MD;  Location: [facility];  Service: General;  Laterality: N/A;  . LAPAROSCOPIC LYSIS OF ADHESIONS  04/12/2014   Procedure: LAPAROSCOPIC LYSIS OF ADHESIONS;  Surgeon: Gayland Curry, MD;  Location: [facility];  Service: General;;  . LAPAROSCOPY N/A 04/12/2014   Procedure: LAPAROSCOPY DIAGNOSTIC;  Surgeon: Gayland Curry, MD;  Location: [facility];  Service: General;  Laterality: N/A;  . wisdom teeth extraction      Family History  Problem Relation Age of Onset  . Hypertension Mother   . Alcohol abuse Father   . Cancer Paternal Grandmother        lung cancer  . Obesity Other    Social History:  reports that she has never smoked. She has never used smokeless tobacco. She reports that she does not drink alcohol or use drugs.  Allergies:  Allergies  Allergen Reactions  . Diphenhydramine Hcl Hives  . Nitrofurantoin Other (See Comments)    unknown    (Not in a hospital admission)   Results for orders placed or performed during the hospital encounter of 12/27/18 (from the past 48 hour(s))  Lipase, blood     Status: None   Collection Time: 12/27/18  3:09 PM  Result Value Ref Range   Lipase 26 11 - 51 U/L    Comment: Performed at [facility], [place] [address] [place], [place] [address]  Comprehensive metabolic panel     Status: Abnormal   Collection Time: 12/27/18  3:09 PM  Result Value Ref Range   Sodium 138 135 - 145 mmol/L   Potassium 3.6 3.5 - 5.1 mmol/L   Chloride 104 98 - 111 mmol/L   CO2 26 22 - 32 mmol/L  Glucose, Bld 131 (H) 70 - 99 mg/dL   BUN 13 6 - 20 mg/dL   Creatinine, Ser 0.27 0.44 - 1.00 mg/dL   Calcium 8.4 (L) 8.9 - 10.3 mg/dL   Total Protein 6.4 (L) 6.5 - 8.1 g/dL   Albumin 3.4 (L) 3.5 - 5.0 g/dL   AST 36 15 - 41 U/L   ALT 40 0 - 44 U/L   Alkaline Phosphatase 45 38 - 126 U/L   Total Bilirubin 0.6 0.3 - 1.2 mg/dL   GFR calc non Af Amer >60 >60 mL/min   GFR calc Af Amer >60 >60 mL/min   Anion gap 8 5 - 15    Comment: Performed at [facility], [address], [place], [place] [address]  CBC     Status: Abnormal   Collection Time: 12/27/18  3:09 PM  Result Value Ref Range   WBC 6.8 4.0 - 10.5 K/uL   RBC 4.27 3.87 - 5.11  MIL/uL   Hemoglobin 11.6 (L) 12.0 - 15.0 g/dL   HCT 78.6 (L) 76.7 - 20.9 %   MCV 83.6 80.0 - 100.0 fL   MCH 27.2 26.0 - 34.0 pg   MCHC 32.5 30.0 - 36.0 g/dL   RDW 47.0 (H) 96.2 - 83.6 %   Platelets 210 150 - 400 K/uL   nRBC 0.0 0.0 - 0.2 %    Comment: Performed at [facility], [address] [place], [place] [address]  I-Stat beta hCG blood, ED     Status: None   Collection Time: 12/27/18  3:28 PM  Result Value Ref Range   I-stat hCG, quantitative <5.0 <5 mIU/mL   Comment 3            Comment:   GEST. AGE      CONC.  (mIU/mL)   <=1 WEEK        5 - 50     2 WEEKS       50 - 500     3 WEEKS       100 - 10,000     4 WEEKS     1,000 - 30,000        FEMALE AND NON-PREGNANT FEMALE:     LESS THAN 5 mIU/mL   Urinalysis, Routine w reflex microscopic     Status: Abnormal   Collection Time: 12/27/18  3:59 PM  Result Value Ref Range   Color, Urine Jalee (A) YELLOW    Comment: BIOCHEMICALS MAY BE AFFECTED BY COLOR   APPearance HAZY (A) CLEAR   Specific Gravity, Urine 1.040 (H) 1.005 - 1.030   pH 5.0 5.0 - 8.0   Glucose, UA NEGATIVE NEGATIVE mg/dL   Hgb urine dipstick SMALL (A) NEGATIVE   Bilirubin Urine SMALL (A) NEGATIVE   Ketones, ur 20 (A) NEGATIVE mg/dL   Protein, ur 654 (A) NEGATIVE mg/dL   Nitrite NEGATIVE NEGATIVE   Leukocytes,Ua TRACE (A) NEGATIVE   RBC / HPF 0-5 0 - 5 RBC/hpf   WBC, UA 6-10 0 - 5 WBC/hpf   Bacteria, UA RARE (A) NONE SEEN   Squamous Epithelial / LPF 6-10 0 - 5   Mucus PRESENT     Comment: Performed at [facility], [address] [place], [place] [address]   No results found.  Review of Systems  Constitutional: Negative for chills, diaphoresis and fever.  HENT: Negative.   Eyes: Negative.   Respiratory: Negative.   Cardiovascular: Negative.   Gastrointestinal: Positive  for abdominal pain and constipation. Negative for diarrhea, nausea and vomiting.  Genitourinary: Negative.   Musculoskeletal: Negative.   Skin: Negative.    Neurological: Positive for headaches.  Endo/Heme/Allergies: Negative.   Psychiatric/Behavioral: Negative.     Blood pressure 122/77, pulse 96, temperature 100.2 F (37.9 C), temperature source Oral, resp. rate 16, SpO2 99 %, unknown if currently breastfeeding. Physical Exam  Constitutional: She is oriented to person, place, and time. She appears well-developed and well-nourished. No distress.  HENT:  Head: Normocephalic and atraumatic.  Right Ear: External ear normal.  Left Ear: External ear normal.  Eyes: Pupils are equal, round, and reactive to light. Conjunctivae are normal. No scleral icterus.  Neck: Normal range of motion. Neck supple. No tracheal deviation present. No thyromegaly present.  Cardiovascular: Normal rate, regular rhythm and normal heart sounds.  No murmur heard. Respiratory: Effort normal and breath sounds normal. No respiratory distress. She has no wheezes.  GI: Soft. Bowel sounds are normal. She exhibits no distension and no mass. There is abdominal tenderness (diffusely mildly tender, max in RLQ). There is guarding (diffusely, max in RLQ). There is no rebound.  Musculoskeletal: Normal range of motion.        General: No deformity or edema.  Neurological: She is alert and oriented to person, place, and time.  Skin: Skin is warm and dry. She is not diaphoretic.  Psychiatric: She has a normal mood and affect. Her behavior is normal.     Assessment/Plan Abdominal pain History of Roux-en-Y gastric bypass procedure, 2015 History of internal hernia with reduction laparoscopically, 2016  Patient is having persistent abdominal pain for 24 hours.  She does not appear toxic and laboratory studies are within normal limits.  On clinical examination, she has significant abdominal tenderness.  Review of her CT scan does show a swirling type pattern which is concerning for possible volvulus.  Patient will most likely require diagnostic laparoscopy by a member of the bariatric  surgical team.  Therefore the patient will be admitted to the surgical service and prepared for the operating room when available.  Darnell Levelodd Rontae Inglett, MD [facility] Office: [phone]    Darnell Levelodd Chloe Bluett, MD 12/28/2018, 4:29 AM

## 2018-12-29 ENCOUNTER — Encounter (HOSPITAL_COMMUNITY): Payer: Self-pay | Admitting: General Surgery

## 2018-12-29 LAB — CBC
HCT: 31 % — ABNORMAL LOW (ref 36.0–46.0)
Hemoglobin: 10.2 g/dL — ABNORMAL LOW (ref 12.0–15.0)
MCH: 27.3 pg (ref 26.0–34.0)
MCHC: 32.9 g/dL (ref 30.0–36.0)
MCV: 82.9 fL (ref 80.0–100.0)
Platelets: 174 10*3/uL (ref 150–400)
RBC: 3.74 MIL/uL — ABNORMAL LOW (ref 3.87–5.11)
RDW: 17.6 % — ABNORMAL HIGH (ref 11.5–15.5)
WBC: 4.3 10*3/uL (ref 4.0–10.5)
nRBC: 0 % (ref 0.0–0.2)

## 2018-12-29 LAB — BASIC METABOLIC PANEL
Anion gap: 9 (ref 5–15)
BUN: 8 mg/dL (ref 6–20)
CO2: 23 mmol/L (ref 22–32)
Calcium: 8.3 mg/dL — ABNORMAL LOW (ref 8.9–10.3)
Chloride: 107 mmol/L (ref 98–111)
Creatinine, Ser: 0.57 mg/dL (ref 0.44–1.00)
GFR calc Af Amer: >60 mL/min (ref >60)
GFR calc non Af Amer: >60 mL/min (ref >60)
Glucose, Bld: 112 mg/dL — ABNORMAL HIGH (ref 70–99)
Potassium: 4 mmol/L (ref 3.5–5.1)
Sodium: 139 mmol/L (ref 135–145)

## 2018-12-29 LAB — SURGICAL PATHOLOGY

## 2018-12-29 MED ORDER — PHENOL 1.4 % MT LIQD
1.0000 | OROMUCOSAL | Status: DC | PRN
  Administered 2018-12-29: 1 via OROMUCOSAL
  Filled 2018-12-29: qty 177

## 2018-12-29 MED ORDER — TRAMADOL HCL 50 MG PO TABS: 50.0000 mg | ORAL_TABLET | Freq: Four times a day (QID) | ORAL | 1 refills | Status: DC | PRN

## 2018-12-29 MED ORDER — ACETAMINOPHEN 500 MG PO TABS: 1000.0000 mg | ORAL_TABLET | Freq: Three times a day (TID) | ORAL | 0 refills | Status: AC

## 2018-12-29 NOTE — Discharge Summary (Signed)
Physician Discharge Summary  Karen Mcintyre WUJ:[id number] DOB: Jul 18, 1990 DOA: 12/27/2018  PCP: Karen Albert, MD  Admit date: 12/27/2018 Discharge date: 12/28/2018  Recommendations for Outpatient Follow-up:    Follow-up Information    Karen Adu, MD. Schedule an appointment as soon as possible for a visit in 3 week(s).   Specialty: General Surgery Why: for postop appt Contact information: [address] [place] [address] [phone]          Discharge Diagnoses:  1. Acute appendicitis 2. 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  She required laparoscopy with reduction of internal hernia and lysis of adhesions in January 2016 also by Dr. Gaynelle Mcintyre.  She has done well since that time.  She delivered a child 2 months ago.  Yesterday she developed lower abdominal discomfort.  She initially felt this might be related to the recent placement of an IUD by her gynecologist.  She was seen and evaluated yesterday morning by her gynecologist including a pelvic ultrasound.  Pain persisted and the patient presented to med  [facility] for evaluation.  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  Patient is therefore transferred to [facility] long for admission and evaluation by the bariatric surgical team.  Patient denies any nausea or vomiting.  She has been having normal bowel movements.  She is passing flatus.  Hospital Course:  Patient was sent to the operating room for diagnostic laparoscopy.  During surgery  she was found to have evidence of acute appendicitis mainly suppurative.  She had evidence of an internal hernia as well.  We reduced the bowel contents.  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  Given the intraoperative infection of the appendix we decided not to proceed with definitive management of the twisted Roux limb.  It did not appear obstructed.  There was no signs of ischemia.  In reviewing prior imaging over the past 2 years it appears this is a chronic finding and the patient.  On postoperative day one the patient was doing well.  She was tolerating diet.  Her pain was well-controlled.  Her vital signs are stable.  We had a long discussion about the intraoperative findings on postoperative day 0 in the evening.  She will follow with me to discuss long-term plans about management of the chronically twisted Roux limb.  However I think she is stable for discharge.  We discussed discharge instructions.  We also advised her to avoid NSAIDs given her history of gastric bypass surgery   Discharge Instructions  Discharge Instructions    Call MD for:   Complete by: As directed    Temperature >101   Call MD for:  hives   Complete by: As directed    Call MD for:  persistant dizziness or light-headedness   Complete by: As directed    Call MD for:  persistant nausea and vomiting   Complete by: As directed    Call MD for:  redness, tenderness, or signs of infection (pain, swelling, redness, odor or  green/yellow discharge around incision site)   Complete by: As directed    Call MD for:  severe uncontrolled pain   Complete by: As directed    Diet general   Complete by: As directed    Discharge instructions   Complete by: As directed    See [facility] discharge instructions   Increase activity slowly   Complete by: As directed      Allergies as of 12/29/2018      Reactions   Diphenhydramine Hcl Hives   Nitrofurantoin Other (See Comments)   unknown      Medication List     STOP taking these medications   benzocaine-Menthol 20-0.5 % Aero Commonly known as: DERMOPLAST   ibuprofen 600 MG tablet Commonly known as: ADVIL     TAKE these medications   acetaminophen 500 MG tablet Commonly known as: TYLENOL Take 2 tablets (1,000 mg total) by mouth 3 (three) times daily for 5 days. What changed:   medication strength  how much to take  when to take this  reasons to take this   traMADol 50 MG tablet Commonly known as: ULTRAM Take 1 tablet (50 mg total) by mouth every 6 (six) hours as needed.      Follow-up Information    Karen AduWilson, Katianna Mcclenney, MD. Schedule an appointment as soon as possible for a visit in 3 week(s).   Specialty: General Surgery Why: for postop appt Contact information: [address] [place] [address] [phone]            The results of significant diagnostics from this hospitalization (including imaging, microbiology, ancillary and laboratory) are listed below for reference.    Significant Diagnostic Studies: Ct Abdomen Pelvis W Contrast  Result Date: 12/28/2018 CLINICAL DATA:  28 year old female with acute abdominal pain. Suprapubic and right lower quadrant pain with intermittent nausea fever and hematuria. IUD recently placed on 12/23/2018. EXAM: CT ABDOMEN AND PELVIS WITH CONTRAST TECHNIQUE: Multidetector CT imaging of the abdomen and pelvis was performed using the standard protocol following bolus administration of intravenous contrast. CONTRAST:  100mL OMNIPAQUE IOHEXOL 300 MG/ML  SOLN COMPARISON:  CT Abdomen and Pelvis 04/10/2014. FINDINGS: Lower chest: Negative aside from mild atelectasis in the costophrenic angles. Hepatobiliary: Negative liver and gallbladder. No bile duct enlargement. Pancreas: The head of the pancreas now appears enlarged on series 2, image 42, although there is no definite peripancreatic inflammation. No pancreatic ductal dilatation is identified. Spleen: Negative. Adrenals/Urinary Tract:  Normal adrenal glands. Lobulated appearance of both kidneys is stable with symmetric renal enhancement. No perinephric stranding. No nephrolithiasis. The proximal ureters appear decompressed and within normal limits. Stomach/Bowel: No free air.  No definite free fluid. The large bowel is redundant with retained stool. The cecum was on a lax mesentery and located in the right upper quadrant previously. The cecum is difficult to delineate today. The appendix and terminal ileum cannot be identified. The terminal ileum is difficult to delineate but there is no pericecal inflammation. 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 The duodenum appears thick walled today and swirls about the mesentery on series 2, image 53. Superimposed previous Roux-en-Y type gastric bypass. The distal stomach is decompressed. The gastrojejunostomy is unremarkable. Small bowel in the mid and lower abdomen is non-dilated but appears somewhat indistinct throughout. The distal small bowel  anastomosis is visible to the left of midline on series 2, image 68 with no definite adverse features. Vascular/Lymphatic: Major arterial structures are enhancing and appear normal. The portal venous system is patent. But noticed a reverse relationship of the SMV and SMA beginning on series 2, image 46. Reproductive: Within normal limits, IUD positioning appears satisfactory (sagittal image 56). Other: No pelvic free fluid. Musculoskeletal: Negative. IMPRESSION: 1. 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 No associated free air or free fluid. 2.  Superimposed previous Roux-en-Y type gastric bypass. The stomach, gastrojejunostomy, and distal small bowel anastomosis all have a more normal appearance. 3. Redundant large bowel. The cecum was previously on a lax mesentery in the right upper quadrant, but cannot be delineated today. 4. Abnormal mass-like appearance of the pancreatic head which I think is artifactual due to the abnormal configuration of the proximal small bowel (pancreas somewhat folded upon itself). No convincing peripancreatic edema and no pancreatic ductal dilatation. 5. IUD in place with no adverse features. Study discussed by telephone with Dr. Zadie Rhine on 12/28/2018 at 01:18 . Electronically Signed   By: Odessa Fleming M.D.   On: 12/28/2018 01:24    Microbiology: Recent Results (from the past 240 hour(s))  SARS CORONAVIRUS 2 (TAT 6-24 HRS) Nasopharyngeal Nasopharyngeal Swab     Status: None   Collection Time: 12/28/18  1:35 AM   Specimen: Nasopharyngeal Swab  Result Value Ref Range Status   SARS Coronavirus 2 NEGATIVE NEGATIVE Final    Comment: (NOTE) SARS-CoV-2 target nucleic acids are NOT DETECTED. The SARS-CoV-2 RNA is generally detectable in upper and lower respiratory specimens during the acute phase of infection. Negative results do not preclude SARS-CoV-2 infection, do not rule out co-infections with other pathogens, and should not be used as the sole basis for treatment or other patient management decisions. Negative results must be combined with clinical observations, patient history, and epidemiological information. The expected result is Negative. Fact Sheet for Patients: HairSlick.no Fact Sheet for Healthcare Providers: quierodirigir.com This test is not yet approved or cleared by the [place] FDA and  has been authorized for detection and/or diagnosis of SARS-CoV-2 by FDA under an Emergency Use Authorization (EUA). This EUA will remain  in effect  (meaning this test can be used) for the duration of the COVID-19 declaration under Section 56 4(b)(1) of the Act, 21 U.S.C. section 360bbb-3(b)(1), unless the authorization is terminated or revoked sooner. Performed at [facility], [address], [place], [place] [address]   SARS Coronavirus 2 by RT PCR (hospital order, performed in [facility] hospital lab) Nasopharyngeal Nasopharyngeal Swab     Status: None   Collection Time: 12/28/18  6:38 AM   Specimen: Nasopharyngeal Swab  Result Value Ref Range Status   SARS Coronavirus 2 NEGATIVE NEGATIVE Final    Comment: (NOTE) If result is NEGATIVE SARS-CoV-2 target nucleic acids are NOT DETECTED. The SARS-CoV-2 RNA is generally detectable in upper and lower  respiratory specimens during the acute phase of infection. The lowest  concentration of SARS-CoV-2 viral copies this assay can detect is 250  copies / mL. A negative result does not preclude SARS-CoV-2 infection  and should not be used as the sole basis for treatment or other  patient management decisions.  A negative result may occur  with  improper specimen collection / handling, submission of specimen other  than nasopharyngeal swab, presence of viral mutation(s) within the  areas targeted by this assay, and inadequate number of viral copies  (<250 copies / mL). A negative result must be combined with clinical  observations, patient history, and epidemiological information. If result is POSITIVE SARS-CoV-2 target nucleic acids are DETECTED. The SARS-CoV-2 RNA is generally detectable in upper and lower  respiratory specimens dur ing the acute phase of infection.  Positive  results are indicative of active infection with SARS-CoV-2.  Clinical  correlation with patient history and other diagnostic information is  necessary to determine patient infection status.  Positive results do  not rule out bacterial infection or co-infection with other viruses. If result is PRESUMPTIVE  POSTIVE SARS-CoV-2 nucleic acids MAY BE PRESENT.   A presumptive positive result was obtained on the submitted specimen  and confirmed on repeat testing.  While 2019 novel coronavirus  (SARS-CoV-2) nucleic acids may be present in the submitted sample  additional confirmatory testing may be necessary for epidemiological  and / or clinical management purposes  to differentiate between  SARS-CoV-2 and other Sarbecovirus currently known to infect humans.  If clinically indicated additional testing with an alternate test  methodology [id number]) is advised. The SARS-CoV-2 RNA is generally  detectable in upper and lower respiratory sp ecimens during the acute  phase of infection. The expected result is Negative. Fact Sheet for Patients:  StrictlyIdeas.no Fact Sheet for Healthcare Providers: BankingDealers.co.za This test is not yet approved or cleared by the [place] FDA and has been authorized for detection and/or diagnosis of SARS-CoV-2 by FDA under an Emergency Use Authorization (EUA).  This EUA will remain in effect (meaning this test can be used) for the duration of the COVID-19 declaration under Section 564(b)(1) of the Act, 21 U.S.C. section 360bbb-3(b)(1), unless the authorization is terminated or revoked sooner. Performed at [facility], [place] [address], [place], [place] [address]      Labs: Basic Metabolic Panel: Recent Labs  Lab 12/27/18 1509 12/29/18 0558  NA 138 139  K 3.6 4.0  CL 104 107  CO2 26 23  GLUCOSE 131* 112*  BUN 13 8  CREATININE 0.83 0.57  CALCIUM 8.4* 8.3*   Liver Function Tests: Recent Labs  Lab 12/27/18 1509  AST 36  ALT 40  ALKPHOS 45  BILITOT 0.6  PROT 6.4*  ALBUMIN 3.4*   Recent Labs  Lab 12/27/18 1509  LIPASE 26   No results for input(s): AMMONIA in the last 168 hours. 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

## 2018-12-29 NOTE — Discharge Instructions (Signed)
[facility] LAPAROSCOPIC SURGERY: POST OP INSTRUCTIONS Always review your discharge instruction sheet given to you by the facility where your surgery was performed. IF YOU HAVE DISABILITY OR FAMILY LEAVE FORMS, YOU MUST BRING THEM TO THE OFFICE FOR PROCESSING.   DO NOT GIVE THEM TO YOUR DOCTOR.  PAIN CONTROL  1. First take acetaminophen (Tylenol)  to control your pain after surgery.  Follow directions on package.  Taking acetaminophen (Tylenol) regularly after surgery will help to control your pain and lower the amount of prescription pain medication you may need.  You should not take more than 3,000 mg (3 grams) of acetaminophen (Tylenol) in 24 hours.  You should not take ibuprofen (Advil), aleve, motrin, naprosyn or other NSAIDS if you have a history of stomach ulcers or chronic kidney disease.  2. A prescription for pain medication may be given to you upon discharge.  Take your pain medication as prescribed, if you still have uncontrolled pain after taking acetaminophen (Tylenol) or ibuprofen (Advil). 3. Use ice packs to help control pain. 4. If you need a refill on your pain medication, please contact your pharmacy.  They will contact our office to request authorization. Prescriptions will not be filled after 5pm or on week-ends.  HOME MEDICATIONS 5. Take your usually prescribed medications unless otherwise directed.  DIET 6. You should follow a light diet the first few days after arrival home.  Be sure to include lots of fluids daily. Avoid fatty, fried foods.   CONSTIPATION 7. It is common to experience some constipation after surgery and if you are taking pain medication.  Increasing fluid intake and taking a stool softener (such as Colace) will usually help or prevent this problem from occurring.  A mild laxative (Milk of Magnesia or Miralax) should be taken according to package instructions if there are no bowel movements after 48 hours.  WOUND/INCISION  CARE 8. Most patients will experience some swelling and bruising in the area of the incisions.  Ice packs will help.  Swelling and bruising can take several days to resolve.  9. Unless discharge instructions indicate otherwise, follow guidelines below  a. STERI-STRIPS - you may remove your outer bandages 48 hours after surgery, and you may shower at that time.  You have steri-strips (small skin tapes) in place directly over the incision.  These strips should be left on the skin for 7-10 days.   b. DERMABOND/SKIN GLUE - you may shower in 24 hours.  The glue will flake off over the next 2-3 weeks. 10. Any sutures or staples will be removed at the office during your follow-up visit.  ACTIVITIES 11. You may resume regular (light) daily activities beginning the next day--such as daily self-care, walking, climbing stairs--gradually increasing activities as tolerated.  You may have sexual intercourse when it is comfortable.  Refrain from any heavy lifting or straining until approved by your doctor. a. You may drive when you are no longer taking prescription pain medication, you can comfortably wear a seatbelt, and you can safely maneuver your car and apply brakes.  FOLLOW-UP 12. You should see your doctor in the office for a follow-up appointment approximately 2-3 weeks after your surgery.  You should have been given your post-op/follow-up appointment when your surgery was scheduled.  If you did not receive a post-op/follow-up appointment, make sure that you call for this appointment within a day or two after you arrive home to insure a convenient appointment time.  OTHER INSTRUCTIONS 13.   WHEN   TO CALL YOUR DOCTOR: 1. Fever over 101.0 2. Inability to urinate 3. Continued bleeding from incision. 4. Increased pain, redness, or drainage from the incision. 5. Increasing abdominal pain  The clinic staff is available to answer your questions during regular business hours.  Please don't hesitate to call  and ask to speak to one of the nurses for clinical concerns.  If you have a medical emergency, go to the nearest emergency room or call 911.  A surgeon from [facility] is always on call at the hospital. [address], [address], [place], [place]  [address] ? [address] [address], [place], [place]   [address] [phone] ? [phone] ? FAX [phone] Web site: [email or web address]  

## 2019-04-18 ENCOUNTER — Encounter: Payer: Self-pay | Admitting: Family Medicine

## 2020-01-30 ENCOUNTER — Other Ambulatory Visit: Payer: Self-pay | Admitting: Student

## 2020-01-30 ENCOUNTER — Other Ambulatory Visit (HOSPITAL_COMMUNITY): Payer: Self-pay | Admitting: Student

## 2020-01-30 DIAGNOSIS — R110 Nausea: Secondary | ICD-10-CM

## 2020-02-06 ENCOUNTER — Other Ambulatory Visit: Payer: Self-pay

## 2020-02-06 ENCOUNTER — Ambulatory Visit (HOSPITAL_COMMUNITY)
Admission: RE | Admit: 2020-02-06 | Discharge: 2020-02-06 | Disposition: A | Discharge status: Home / Self Care | Payer: BC Managed Care – PPO | Source: Ambulatory Visit | Attending: Student | Admitting: Student

## 2020-02-06 ENCOUNTER — Other Ambulatory Visit (HOSPITAL_COMMUNITY): Payer: Self-pay | Admitting: Student

## 2020-02-06 DIAGNOSIS — R110 Nausea: Secondary | ICD-10-CM

## 2020-02-06 LAB — PREGNANCY, URINE: Preg Test, Ur: NEGATIVE

## 2020-03-01 ENCOUNTER — Ambulatory Visit: Admitting: Dietician

## 2020-03-17 NOTE — L&D Delivery Note (Signed)
Delivery Note:   [name] at [redacted]w[redacted]d  Admitting diagnosis: Normal labor and delivery [O80] Risks: None Onset of labor: 11/13/2020 at 1600 IOL/Augmentation: N/A ROM: 11/13/2020 at 2245, clear fluid  Complete dilation at 11/13/2020  2258 Onset of pushing at 2300 FHR second stage Cat I  Analgesia /Anesthesia intrapartum:Epidural  Pushing in lithotomy position with CNM and L&D staff support. Husband, Karen Mcintyre,  present for birth and supportive.  Delivery of a Live born female  Birth Weight: 3960gm, 8lb 11.7oz APGAR: 9, 9  Newborn Delivery   Birth date/time: 11/13/2020 23:36:00 Delivery type: Vaginal, Spontaneous     in cephalic presentation, position OA to ROA.  APGAR:1 min-9 , 5 min-9   Nuchal Cord: No  Cord double clamped after cessation of pulsation, cut by Karen Mcintyre.  Collection of cord blood for typing completed. Cord blood donation-None  Arterial cord blood sample-No    Placenta delivered-Spontaneous  with 3 vessels . Uterotonics: Pitocin Placenta to L&D Uterine tone firm  Bleeding scant  2nd degree  laceration identified.  Episiotomy:None  Local analgesia: N/A  Repair: 3-0 Vicryl in usual fashion Est. Blood Loss (mL):324.00   Complications: None  Mom to postpartum. Karen Mcintyre to Couplet care / Skin to Skin.  Delivery Report:   Review the Delivery Report for details.    [name], CNM, MSN 11/14/2020, 12:18 AM

## 2020-04-03 ENCOUNTER — Ambulatory Visit: Payer: BC Managed Care – PPO | Admitting: Professional

## 2020-04-04 ENCOUNTER — Ambulatory Visit (INDEPENDENT_AMBULATORY_CARE_PROVIDER_SITE_OTHER): Payer: BC Managed Care – PPO | Admitting: Professional

## 2020-04-04 DIAGNOSIS — O906 Postpartum mood disturbance: Secondary | ICD-10-CM | POA: Diagnosis not present

## 2020-04-04 DIAGNOSIS — F4323 Adjustment disorder with mixed anxiety and depressed mood: Secondary | ICD-10-CM | POA: Diagnosis not present

## 2020-04-11 ENCOUNTER — Ambulatory Visit (INDEPENDENT_AMBULATORY_CARE_PROVIDER_SITE_OTHER): Payer: BC Managed Care – PPO | Admitting: Professional

## 2020-04-11 DIAGNOSIS — O906 Postpartum mood disturbance: Secondary | ICD-10-CM | POA: Diagnosis not present

## 2020-04-11 DIAGNOSIS — F4323 Adjustment disorder with mixed anxiety and depressed mood: Secondary | ICD-10-CM

## 2020-04-16 ENCOUNTER — Ambulatory Visit: Admitting: Professional

## 2020-04-17 ENCOUNTER — Ambulatory Visit: Admitting: Professional

## 2020-05-02 ENCOUNTER — Ambulatory Visit (INDEPENDENT_AMBULATORY_CARE_PROVIDER_SITE_OTHER): Payer: BC Managed Care – PPO | Admitting: Professional

## 2020-05-02 DIAGNOSIS — F4323 Adjustment disorder with mixed anxiety and depressed mood: Secondary | ICD-10-CM | POA: Diagnosis not present

## 2020-05-19 ENCOUNTER — Ambulatory Visit: Admitting: Professional

## 2020-05-26 ENCOUNTER — Ambulatory Visit (INDEPENDENT_AMBULATORY_CARE_PROVIDER_SITE_OTHER): Payer: BC Managed Care – PPO | Admitting: Professional

## 2020-05-26 DIAGNOSIS — F4323 Adjustment disorder with mixed anxiety and depressed mood: Secondary | ICD-10-CM | POA: Diagnosis not present

## 2020-05-31 ENCOUNTER — Ambulatory Visit (INDEPENDENT_AMBULATORY_CARE_PROVIDER_SITE_OTHER): Payer: BC Managed Care – PPO | Admitting: Professional

## 2020-05-31 DIAGNOSIS — F4323 Adjustment disorder with mixed anxiety and depressed mood: Secondary | ICD-10-CM | POA: Diagnosis not present

## 2020-06-07 ENCOUNTER — Ambulatory Visit: Admitting: Professional

## 2020-06-14 ENCOUNTER — Ambulatory Visit: Admitting: Professional

## 2020-09-27 ENCOUNTER — Other Ambulatory Visit: Payer: Self-pay | Admitting: Obstetrics & Gynecology

## 2020-10-02 ENCOUNTER — Other Ambulatory Visit: Payer: Self-pay | Admitting: Obstetrics & Gynecology

## 2020-10-02 DIAGNOSIS — O99013 Anemia complicating pregnancy, third trimester: Secondary | ICD-10-CM

## 2020-10-11 ENCOUNTER — Other Ambulatory Visit: Payer: Self-pay

## 2020-10-11 ENCOUNTER — Non-Acute Institutional Stay (HOSPITAL_COMMUNITY)
Admission: RE | Admit: 2020-10-11 | Discharge: 2020-10-11 | Disposition: A | Discharge status: Home / Self Care | Payer: BC Managed Care – PPO | Source: Ambulatory Visit | Attending: Internal Medicine | Admitting: Internal Medicine

## 2020-10-11 DIAGNOSIS — O99013 Anemia complicating pregnancy, third trimester: Secondary | ICD-10-CM | POA: Diagnosis present

## 2020-10-11 DIAGNOSIS — Z3A00 Weeks of gestation of pregnancy not specified: Secondary | ICD-10-CM | POA: Insufficient documentation

## 2020-10-11 MED ORDER — SODIUM CHLORIDE 0.9 % IV SOLN
500.0000 mg | INTRAVENOUS | Status: DC
  Administered 2020-10-11: 500 mg via INTRAVENOUS
  Filled 2020-10-11: qty 25

## 2020-10-11 MED ORDER — ACETAMINOPHEN 500 MG PO TABS
500.0000 mg | ORAL_TABLET | ORAL | Status: DC
  Administered 2020-10-11: 500 mg via ORAL
  Filled 2020-10-11: qty 1

## 2020-10-11 MED ORDER — SODIUM CHLORIDE 0.9 % IV SOLN
INTRAVENOUS | Status: DC | PRN
  Administered 2020-10-11: 250 mL via INTRAVENOUS

## 2020-10-11 NOTE — Progress Notes (Signed)
Patient received IV  Venofer ( dose 1 of 2) as ordered by Shea Evans MD. Pt premedicated with PO Tylenol. Pt states she took a Claritin at 7:00am as instructed by her OBYGYN, pt states she has an allergy to Benadryl. 2.5 hours into infusion, pt reports feeling hot all over, VSS, no other symptoms verbalized or noted. Dr. Camillia Herter RN Delaney Meigs notified. Per Dr. Juliene Pina, complete the remainder of the iron infusion, no additional premeds ordered, and pt does not need to repeat dose in two weeks as originally ordered. Pt informed, verbalized understanding. Venofer infusion resumed, pt tolerated well, pt states she did not experience any further side effects for the remained of the infusion. Vitals stable, discharge instructions given , verbalized understanding. Patient alert, oriented, and ambulatory at the time of discharge.

## 2020-11-13 ENCOUNTER — Encounter (HOSPITAL_COMMUNITY): Payer: Self-pay

## 2020-11-13 ENCOUNTER — Inpatient Hospital Stay (HOSPITAL_COMMUNITY): Payer: BC Managed Care – PPO | Admitting: Anesthesiology

## 2020-11-13 ENCOUNTER — Other Ambulatory Visit: Payer: Self-pay

## 2020-11-13 ENCOUNTER — Inpatient Hospital Stay (HOSPITAL_COMMUNITY)
Admission: AD | Admit: 2020-11-13 | Discharge: 2020-11-15 | DRG: 806 | Disposition: A | Discharge status: Home / Self Care | Payer: BC Managed Care – PPO | Attending: Obstetrics and Gynecology | Admitting: Obstetrics and Gynecology

## 2020-11-13 DIAGNOSIS — O99844 Bariatric surgery status complicating childbirth: Secondary | ICD-10-CM | POA: Diagnosis present

## 2020-11-13 DIAGNOSIS — O9902 Anemia complicating childbirth: Secondary | ICD-10-CM | POA: Diagnosis not present

## 2020-11-13 DIAGNOSIS — O99344 Other mental disorders complicating childbirth: Principal | ICD-10-CM | POA: Diagnosis present

## 2020-11-13 DIAGNOSIS — D62 Acute posthemorrhagic anemia: Secondary | ICD-10-CM | POA: Diagnosis not present

## 2020-11-13 DIAGNOSIS — Z20822 Contact with and (suspected) exposure to covid-19: Secondary | ICD-10-CM | POA: Diagnosis present

## 2020-11-13 DIAGNOSIS — Z3A38 38 weeks gestation of pregnancy: Secondary | ICD-10-CM | POA: Diagnosis not present

## 2020-11-13 DIAGNOSIS — F32A Depression, unspecified: Secondary | ICD-10-CM | POA: Diagnosis present

## 2020-11-13 DIAGNOSIS — O9081 Anemia of the puerperium: Secondary | ICD-10-CM | POA: Diagnosis not present

## 2020-11-13 DIAGNOSIS — O26893 Other specified pregnancy related conditions, third trimester: Secondary | ICD-10-CM | POA: Diagnosis present

## 2020-11-13 DIAGNOSIS — F419 Anxiety disorder, unspecified: Secondary | ICD-10-CM | POA: Diagnosis present

## 2020-11-13 LAB — CBC
HCT: 36.4 % (ref 36.0–46.0)
Hemoglobin: 12.1 g/dL (ref 12.0–15.0)
MCH: 28.1 pg (ref 26.0–34.0)
MCHC: 33.2 g/dL (ref 30.0–36.0)
MCV: 84.5 fL (ref 80.0–100.0)
Platelets: 296 10*3/uL (ref 150–400)
RBC: 4.31 MIL/uL (ref 3.87–5.11)
RDW: 14.6 % (ref 11.5–15.5)
WBC: 11.6 10*3/uL — ABNORMAL HIGH (ref 4.0–10.5)
nRBC: 0 % (ref 0.0–0.2)

## 2020-11-13 LAB — TYPE AND SCREEN
ABO/RH(D): A POS
Antibody Screen: NEGATIVE

## 2020-11-13 LAB — RESP PANEL BY RT-PCR (FLU A&B, COVID) ARPGX2
Influenza A by PCR: NEGATIVE
Influenza B by PCR: NEGATIVE
SARS Coronavirus 2 by RT PCR: NEGATIVE

## 2020-11-13 MED ORDER — LACTATED RINGERS IV SOLN
500.0000 mL | Freq: Once | INTRAVENOUS | Status: AC
  Administered 2020-11-13: 500 mL via INTRAVENOUS

## 2020-11-13 MED ORDER — OXYTOCIN BOLUS FROM INFUSION
333.0000 mL | Freq: Once | INTRAVENOUS | Status: AC
  Administered 2020-11-13: 333 mL via INTRAVENOUS

## 2020-11-13 MED ORDER — SOD CITRATE-CITRIC ACID 500-334 MG/5ML PO SOLN: 30.0000 mL | ORAL | Status: DC | PRN

## 2020-11-13 MED ORDER — PHENYLEPHRINE 40 MCG/ML (10ML) SYRINGE FOR IV PUSH (FOR BLOOD PRESSURE SUPPORT)
80.0000 ug | PREFILLED_SYRINGE | INTRAVENOUS | Status: DC | PRN
  Filled 2020-11-13: qty 10

## 2020-11-13 MED ORDER — OXYCODONE-ACETAMINOPHEN 5-325 MG PO TABS: 1.0000 | ORAL_TABLET | ORAL | Status: DC | PRN

## 2020-11-13 MED ORDER — OXYCODONE-ACETAMINOPHEN 5-325 MG PO TABS: 2.0000 | ORAL_TABLET | ORAL | Status: DC | PRN

## 2020-11-13 MED ORDER — ACETAMINOPHEN 325 MG PO TABS: 650.0000 mg | ORAL_TABLET | ORAL | Status: DC | PRN

## 2020-11-13 MED ORDER — SODIUM BICARBONATE 8.4 % IV SOLN
INTRAVENOUS | Status: DC | PRN
  Administered 2020-11-13: 4 mL via EPIDURAL
  Administered 2020-11-13: 3 mL via EPIDURAL

## 2020-11-13 MED ORDER — ONDANSETRON HCL 4 MG/2ML IJ SOLN
4.0000 mg | Freq: Four times a day (QID) | INTRAMUSCULAR | Status: DC | PRN
Start: 2020-11-13 — End: 2020-11-14

## 2020-11-13 MED ORDER — EPHEDRINE 5 MG/ML INJ: 10.0000 mg | INTRAVENOUS | Status: DC | PRN

## 2020-11-13 MED ORDER — LACTATED RINGERS IV SOLN: INTRAVENOUS | Status: DC

## 2020-11-13 MED ORDER — DIPHENHYDRAMINE HCL 50 MG/ML IJ SOLN
12.5000 mg | INTRAMUSCULAR | Status: DC | PRN
Start: 2020-11-13 — End: 2020-11-14

## 2020-11-13 MED ORDER — OXYTOCIN-SODIUM CHLORIDE 30-0.9 UT/500ML-% IV SOLN
2.5000 [IU]/h | INTRAVENOUS | Status: DC
  Administered 2020-11-14: 2.5 [IU]/h via INTRAVENOUS
  Filled 2020-11-13 (×2): qty 500

## 2020-11-13 MED ORDER — FENTANYL-BUPIVACAINE-NACL 0.5-0.125-0.9 MG/250ML-% EP SOLN
12.0000 mL/h | EPIDURAL | Status: DC | PRN
  Filled 2020-11-13: qty 250

## 2020-11-13 MED ORDER — FLEET ENEMA 7-19 GM/118ML RE ENEM: 1.0000 | ENEMA | RECTAL | Status: DC | PRN

## 2020-11-13 MED ORDER — LIDOCAINE HCL (PF) 1 % IJ SOLN
INTRAMUSCULAR | Status: DC | PRN
  Administered 2020-11-13 (×2): 4 mL via EPIDURAL

## 2020-11-13 MED ORDER — FENTANYL CITRATE (PF) 100 MCG/2ML IJ SOLN
50.0000 ug | INTRAMUSCULAR | Status: DC | PRN
  Administered 2020-11-13: 100 ug via INTRAVENOUS
  Filled 2020-11-13: qty 2

## 2020-11-13 MED ORDER — LIDOCAINE HCL (PF) 1 % IJ SOLN: 30.0000 mL | INTRAMUSCULAR | Status: DC | PRN

## 2020-11-13 MED ORDER — PHENYLEPHRINE 40 MCG/ML (10ML) SYRINGE FOR IV PUSH (FOR BLOOD PRESSURE SUPPORT)
80.0000 ug | PREFILLED_SYRINGE | INTRAVENOUS | Status: DC | PRN
Start: 2020-11-13 — End: 2020-11-14
  Administered 2020-11-13 (×2): 80 ug via INTRAVENOUS

## 2020-11-13 MED ORDER — LACTATED RINGERS IV SOLN: 500.0000 mL | INTRAVENOUS | Status: DC | PRN

## 2020-11-13 NOTE — Anesthesia Preprocedure Evaluation (Signed)
Anesthesia Evaluation  Patient identified by MRN, date of birth, ID band Patient awake    Reviewed: Allergy & Precautions, Patient's Chart, lab work & pertinent test results  Airway Mallampati: II  TM Distance: >3 FB Neck ROM: Full    Dental no notable dental hx. (+) Teeth Intact   Pulmonary neg pulmonary ROS,    Pulmonary exam normal breath sounds clear to auscultation       Cardiovascular Normal cardiovascular exam Rhythm:Regular Rate:Normal     Neuro/Psych Depression negative neurological ROS     GI/Hepatic Neg liver ROS, S/P gastric bypass   Endo/Other  Obesity  Renal/GU negative Renal ROS  negative genitourinary   Musculoskeletal negative musculoskeletal ROS (+)   Abdominal (+) + obese,   Peds  Hematology negative hematology ROS (+)   Anesthesia Other Findings   Reproductive/Obstetrics (+) Pregnancy                             Anesthesia Physical Anesthesia Plan  ASA: 2  Anesthesia Plan: Epidural   Post-op Pain Management:    Induction:   PONV Risk Score and Plan:   Airway Management Planned: Natural Airway  Additional Equipment:   Intra-op Plan:   Post-operative Plan:   Informed Consent: I have reviewed the patients History and Physical, chart, labs and discussed the procedure including the risks, benefits and alternatives for the proposed anesthesia with the patient or authorized representative who has indicated his/her understanding and acceptance.       Plan Discussed with: Anesthesiologist  Anesthesia Plan Comments:         Anesthesia Quick Evaluation

## 2020-11-13 NOTE — MAU Note (Signed)
PT states she's had contractions since 1600, now 5-7 mins apart. Rating pain 6-7. Endorses some spotting, denies LOF. +FM.

## 2020-11-13 NOTE — H&P (Signed)
OB ADMISSION/ HISTORY & PHYSICAL:  Admission Date: 11/13/2020  5:41 PM  Admit Diagnosis: Normal labor and delivery [O80]    Karen Mcintyre is a 30 y.o. female at [redacted]w[redacted]d presenting for contractions since 1600. Arrived in the MAU at 1830 and was 3cm and progressed to 5cm over the next few hours. Rates pain 8/10 on pain scale. Denies leaking of fluid or vaginal bleeding. Endorses + fetal movement. Husband, Earvin Hansen, at the bedside providing support. Eagerly anticipating baby boy "Lonni Fix".   Prenatal History: [id number]   EDC : 11/21/2020 Prenatal care at [facility] since 10 weeks, primary Dr. Juliene Pina  Prenatal course complicated by: History of gastric bypass surgery Anxiety and depression, stable on Buspar 5mg  and Zoloft 25mg  PCOS, RPL, conceived with Clomid  Anemia, stable in PO iron  Prenatal Labs: ABO, Rh:   A POS Antibody: PENDING (08/30 2145) Rubella:   Immune RPR:   Non-reactive HBsAg:   Negative HIV:   Negative GBS:   Negative 1 hr Glucola : 119 Genetic Screening: NIPS low risk XY, AFP-1 negative Ultrasound: normal XY anatomy, placenta and cord WNL, last EFW at 34 weeks 94%  TDaP          UTD COVID-19 Declined    Maternal Diabetes: No Genetic Screening: Normal Maternal Ultrasounds/Referrals: Normal Fetal Ultrasounds or other Referrals:  None Maternal Substance Abuse:  No Significant Maternal Medications:  Meds include: Zoloft Significant Maternal Lab Results:  Group B Strep negative Other Comments:  None  Medical / Surgical History : Past medical history:  Past Medical History:  Diagnosis Date   Epigastric pain 03-2014   Possible Internal Hernia   Medical history non-contributory    Obesity    Obesity     Past surgical history:  Past Surgical History:  Procedure Laterality Date   GASTRIC ROUX-EN-Y N/A 08/01/2013   Procedure: LAPAROSCOPIC ROUX-EN-Y GASTRIC BYPASS WITH UPPER ENDOSCOPY WITH LAPAROSCOPIC REPAIR OF HIATAL HERNIA;  Surgeon: 04-2014, MD;  Location:  [facility];  Service: General;  Laterality: N/A;   GASTRIC ROUX-EN-Y N/A 12/28/2018   Procedure: DIAGNOSTIC LAPAROSCOPY, LYSIS OF ADHESIONS, LAPAROSCOPIC APPENDECTOMY;  Surgeon: Atilano Ina, MD;  Location: [facility];  Service: General;  Laterality: N/A;   LAPAROSCOPIC INTERNAL HERNIA REPAIR N/A 04/12/2014   Procedure: REDUCTION OF INTERNAL HERNIA REPAIR;  Surgeon: Gaynelle Adu, MD;  Location: [facility];  Service: General;  Laterality: N/A;   LAPAROSCOPIC LYSIS OF ADHESIONS  04/12/2014   Procedure: LAPAROSCOPIC LYSIS OF ADHESIONS;  Surgeon: Atilano Ina, MD;  Location: [facility];  Service: General;;   LAPAROSCOPY N/A 04/12/2014   Procedure: LAPAROSCOPY DIAGNOSTIC;  Surgeon: Atilano Ina, MD;  Location: [facility];  Service: General;  Laterality: N/A;   wisdom teeth extraction      Family History:  Family History  Problem Relation Age of Onset   Hypertension Mother    Alcohol abuse Father    Cancer Paternal Grandmother        lung cancer   Obesity Other     Social History:  reports that she has never smoked. She has never used smokeless tobacco. She reports that she does not drink alcohol and does not use drugs. Allergies: Diphenhydramine hcl and Nitrofurantoin   Current Medications at time of admission:  Medications Prior to Admission  Medication Sig Dispense Refill Last Dose   busPIRone (BUSPAR) 10 MG tablet Take 10 mg by mouth 3 (three) times daily.   Past Month   Prenatal Vit-Fe Fumarate-FA (MULTIVITAMIN-PRENATAL) 27-0.8 MG  TABS tablet Take 1 tablet by mouth daily at 12 noon.   Past Month   sertraline (ZOLOFT) 25 MG tablet Take 25 mg by mouth daily.   11/13/2020   traMADol (ULTRAM) 50 MG tablet Take 1 tablet (50 mg total) by mouth every 6 (six) hours as needed. 10 tablet 1     Review of Systems: Review of Systems  All other systems reviewed and are negative.  Physical Exam: Vital signs and nursing notes reviewed.  Patient Vitals for the past 24 hrs:  BP Temp Temp src Pulse Resp SpO2 Height  Weight  11/13/20 2207 127/71 98 F (36.7 C) Oral 85 20 -- 5\' 4"  (1.626 m) 101.3 kg  11/13/20 2121 -- -- -- -- -- 100 % -- --  11/13/20 2041 -- -- -- -- -- 97 % -- --  11/13/20 2036 -- -- -- -- -- 97 % -- --  11/13/20 2026 -- -- -- -- -- 97 % -- --  11/13/20 2021 -- -- -- -- -- 100 % -- --  11/13/20 2016 -- -- -- -- -- 99 % -- --  11/13/20 2011 -- -- -- -- -- 99 % -- --  11/13/20 1851 135/79 -- -- 79 -- 96 % -- --  11/13/20 1847 -- -- -- -- -- 98 % -- --  11/13/20 1841 -- -- -- -- -- 97 % -- --  11/13/20 1836 -- -- -- -- -- 96 % -- --  11/13/20 1835 138/84 97.9 F (36.6 C) Oral 92 18 -- -- --  11/13/20 1827 -- -- -- -- -- -- -- 101.3 kg    General: AAO x 3, NAD Heart: RRR Lungs:CTAB Abdomen: Gravid, NT Extremities: no edema Genitalia / VE: Dilation: 5 Effacement (%): 90 Cervical Position: Middle Station: -2 Presentation: Vertex Exam by:: tlytle RN   FHR: 125BPM, mod variability, + accels, no decels TOCO: Contractions 2-3 minutes  Labs:   Pending T&S, CBC, RPR  No results for input(s): WBC, HGB, HCT, PLT in the last 72 hours.  Assessment:  30 y.o. G4P1021 at [redacted]w[redacted]d, spontaneous labor  1. Active stage of labor 2. FHR category 1 3. GBS negative 4. Desires to breastfeed 5. Plans epidural 6. Anemia, stable on PO iron 7. Depression/anxiety - stable of Zoloft and Buspar 8. History of gastric bypass  Plan:  1. Admit to BS 2. Routine L&D orders 3. Analgesia/anesthesia PRN  4. Expectant management 5. Anticipate NSVB 6. Close PP F/U for mood  Dr. [redacted]w[redacted]d notified of admission/plan of care.  Billy Coast CNM, MSN 11/13/2020, 10:21 PM

## 2020-11-13 NOTE — Anesthesia Procedure Notes (Addendum)
Epidural Patient location during procedure: OB Start time: 11/13/2020 10:45 PM End time: 11/13/2020 10:53 PM  Staffing Anesthesiologist: Mal Amabile, MD Performed: anesthesiologist   Preanesthetic Checklist Completed: patient identified, IV checked, site marked, risks and benefits discussed, surgical consent, monitors and equipment checked, pre-op evaluation and timeout performed  Epidural Patient position: sitting Prep: DuraPrep and site prepped and draped Patient monitoring: continuous pulse ox and blood pressure Approach: midline Location: L3-L4 Injection technique: LOR air  Needle:  Needle type: Tuohy  Needle gauge: 17 G Needle length: 9 cm and 9 Needle insertion depth: 6 cm Catheter type: closed end flexible Catheter size: 19 Gauge Catheter at skin depth: 11 cm Test dose: negative and Other  Assessment Events: blood not aspirated, injection not painful, no injection resistance, no paresthesia and negative IV test  Additional Notes Patient identified. Risks and benefits discussed including failed block, incomplete  Pain control, post dural puncture headache, nerve damage, paralysis, blood pressure Changes, nausea, vomiting, reactions to medications-both toxic and allergic and post Partum back pain. All questions were answered. Patient expressed understanding and wished to proceed. Sterile technique was used throughout procedure. Epidural site was Dressed with sterile barrier dressing. No paresthesias, signs of intravascular injection Or signs of intrathecal spread were encountered.  Patient was more comfortable after the epidural was dosed. Please see RN's note for documentation of vital signs and FHR which are stable. Reason for block:procedure for pain

## 2020-11-14 ENCOUNTER — Encounter (HOSPITAL_COMMUNITY): Payer: Self-pay | Admitting: Obstetrics and Gynecology

## 2020-11-14 DIAGNOSIS — F419 Anxiety disorder, unspecified: Secondary | ICD-10-CM | POA: Diagnosis present

## 2020-11-14 DIAGNOSIS — F32A Depression, unspecified: Secondary | ICD-10-CM | POA: Diagnosis present

## 2020-11-14 LAB — CBC
HCT: 27.7 % — ABNORMAL LOW (ref 36.0–46.0)
Hemoglobin: 9.2 g/dL — ABNORMAL LOW (ref 12.0–15.0)
MCH: 27.9 pg (ref 26.0–34.0)
MCHC: 33.2 g/dL (ref 30.0–36.0)
MCV: 83.9 fL (ref 80.0–100.0)
Platelets: 243 10*3/uL (ref 150–400)
RBC: 3.3 MIL/uL — ABNORMAL LOW (ref 3.87–5.11)
RDW: 14.6 % (ref 11.5–15.5)
WBC: 13.3 10*3/uL — ABNORMAL HIGH (ref 4.0–10.5)
nRBC: 0 % (ref 0.0–0.2)

## 2020-11-14 LAB — RPR: RPR Ser Ql: NONREACTIVE

## 2020-11-14 MED ORDER — WITCH HAZEL-GLYCERIN EX PADS: 1.0000 "application " | MEDICATED_PAD | CUTANEOUS | Status: DC | PRN

## 2020-11-14 MED ORDER — COCONUT OIL OIL: 1.0000 "application " | TOPICAL_OIL | Status: DC | PRN

## 2020-11-14 MED ORDER — BENZOCAINE-MENTHOL 20-0.5 % EX AERO
1.0000 "application " | INHALATION_SPRAY | CUTANEOUS | Status: DC | PRN
  Filled 2020-11-14: qty 56

## 2020-11-14 MED ORDER — POLYSACCHARIDE IRON COMPLEX 150 MG PO CAPS
150.0000 mg | ORAL_CAPSULE | Freq: Every day | ORAL | Status: DC
  Administered 2020-11-14 – 2020-11-15 (×2): 150 mg via ORAL
  Filled 2020-11-14 (×2): qty 1

## 2020-11-14 MED ORDER — SENNOSIDES-DOCUSATE SODIUM 8.6-50 MG PO TABS
2.0000 | ORAL_TABLET | Freq: Every day | ORAL | Status: DC
  Administered 2020-11-14 – 2020-11-15 (×2): 2 via ORAL
  Filled 2020-11-14 (×2): qty 2

## 2020-11-14 MED ORDER — TETANUS-DIPHTH-ACELL PERTUSSIS 5-2.5-18.5 LF-MCG/0.5 IM SUSY: 0.5000 mL | PREFILLED_SYRINGE | Freq: Once | INTRAMUSCULAR | Status: DC

## 2020-11-14 MED ORDER — MAGNESIUM OXIDE -MG SUPPLEMENT 400 (240 MG) MG PO TABS
400.0000 mg | ORAL_TABLET | Freq: Every day | ORAL | Status: DC
Start: 2020-11-14 — End: 2020-11-15
  Administered 2020-11-14 – 2020-11-15 (×2): 400 mg via ORAL
  Filled 2020-11-14 (×2): qty 1

## 2020-11-14 MED ORDER — ONDANSETRON HCL 4 MG/2ML IJ SOLN: 4.0000 mg | INTRAMUSCULAR | Status: DC | PRN

## 2020-11-14 MED ORDER — PRENATAL MULTIVITAMIN CH
1.0000 | ORAL_TABLET | Freq: Every day | ORAL | Status: DC
  Administered 2020-11-14 – 2020-11-15 (×2): 1 via ORAL
  Filled 2020-11-14 (×2): qty 1

## 2020-11-14 MED ORDER — IBUPROFEN 600 MG PO TABS
600.0000 mg | ORAL_TABLET | Freq: Four times a day (QID) | ORAL | Status: DC
  Administered 2020-11-14 – 2020-11-15 (×6): 600 mg via ORAL
  Filled 2020-11-14 (×5): qty 1

## 2020-11-14 MED ORDER — ZOLPIDEM TARTRATE 5 MG PO TABS: 5.0000 mg | ORAL_TABLET | Freq: Every evening | ORAL | Status: DC | PRN

## 2020-11-14 MED ORDER — SIMETHICONE 80 MG PO CHEW: 80.0000 mg | CHEWABLE_TABLET | ORAL | Status: DC | PRN

## 2020-11-14 MED ORDER — DIBUCAINE (PERIANAL) 1 % EX OINT: 1.0000 "application " | TOPICAL_OINTMENT | CUTANEOUS | Status: DC | PRN

## 2020-11-14 MED ORDER — ACETAMINOPHEN 325 MG PO TABS
650.0000 mg | ORAL_TABLET | ORAL | Status: DC | PRN
  Administered 2020-11-14 (×2): 650 mg via ORAL
  Filled 2020-11-14 (×2): qty 2

## 2020-11-14 MED ORDER — ONDANSETRON HCL 4 MG PO TABS: 4.0000 mg | ORAL_TABLET | ORAL | Status: DC | PRN

## 2020-11-14 NOTE — Anesthesia Postprocedure Evaluation (Signed)
Anesthesia Post Note  Patient: Karen Mcintyre  Procedure(s) Performed: AN AD HOC LABOR EPIDURAL     Patient location during evaluation: Mother Baby Anesthesia Type: Epidural Level of consciousness: awake and alert and oriented Pain management: satisfactory to patient Vital Signs Assessment: post-procedure vital signs reviewed and stable Respiratory status: respiratory function stable Cardiovascular status: stable Postop Assessment: no headache, no backache, epidural receding, patient able to bend at knees, no signs of nausea or vomiting, adequate PO intake and able to ambulate Anesthetic complications: no   No notable events documented.  Last Vitals:  Vitals:   11/14/20 0235 11/14/20 0635  BP: (!) 144/79 132/74  Pulse: 79 73  Resp: 18 19  Temp: 36.6 C 36.7 C  SpO2: 98% 98%    Last Pain:  Vitals:   11/14/20 0635  TempSrc: Oral  PainSc: 0-No pain   Pain Goal:                   Richmond Coldren

## 2020-11-14 NOTE — Social Work (Signed)
MOB was referred for history of depression and anxiety.   * Referral screened out by Clinical Social Worker because none of the following criteria appear to apply:  ~ History of anxiety/depression during this pregnancy, or of post-partum depression following prior delivery. ~ Diagnosis of anxiety and/or depression within last 3 years. OR * MOB's symptoms currently being treated with medication and/or therapy. CSW reviewed chart and notes MOB currently prescribed Buspar 5mg  and Zoloft 25mg .   MOB scored a 4 on the Postpartum Depression Scale.  Please contact the Clinical Social Worker if needs arise or by MOB request.  , LCSWA Clinical Social Work [place] [facility] [place] [facility]  [phone]

## 2020-11-14 NOTE — Lactation Note (Signed)
This note was copied from a baby's chart. Lactation Consultation Note  Patient Name: Boy Lannah Koike Today's Date: 11/14/2020 Reason for consult: Initial assessment Age:31 Hours  LC brochure given to patient. Mother reports that she didn't breastfeed her 10 yr old. P2, infant is 38+6. Mother reports that she has attempt to breastfeed this infant several times  and has been spoon feeding ebm to infant.  Infant is lying STS. Assist mother with hand expression. 1 ml obtained and spoon fed to infant.   Mother has a hand pump at the bedside that she was sized with the #24 flange. Observed large drops of colostrum. FOB here with 62 yr old son to visit with infant.  Suggested that mother page for [facility] assistance with next feeding.      Maternal Data    Feeding Mother's Current Feeding Choice: Breast Milk  LATCH Score Latch: Too sleepy or reluctant, no latch achieved, no sucking elicited.  Audible Swallowing: A few with stimulation  Type of Nipple: Everted at rest and after stimulation  Comfort (Breast/Nipple): Soft / non-tender  Hold (Positioning): Assistance needed to correctly position infant at breast and maintain latch.  LATCH Score: 6   Lactation Tools Discussed/Used Tools: Flanges Flange Size: 24 Breast pump type: Manual  Interventions Interventions: Assisted with latch  Discharge Pump: Personal;Manual (Medela and Elvi)  Consult Status Consult Status: Follow-up Date: 11/14/20 Follow-up type: In-patient    Stevan Born [facility] 11/14/2020, 11:03 AM

## 2020-11-14 NOTE — Progress Notes (Signed)
Mom states she has back pain that comes and goes but not constant. Mom told to walk the halls and let RN know if it comes back and we can call MD.

## 2020-11-14 NOTE — Progress Notes (Signed)
    PPD # 1 S/P NSVD  Live born female  Birth Weight: 8 lb 11.7 oz (3960 g) APGAR: 9, 9  Newborn Delivery   Birth date/time: 11/13/2020 23:36:00 Delivery type: Vaginal, Spontaneous     Baby name: Lonni Fix Delivering provider: Dorisann Frames K  Episiotomy:None   Lacerations:2nd degree   circumcision planned inpatient  Feeding: breast  Pain control at delivery: Epidural   S:  Reports feeling well.             Tolerating po/ No nausea or vomiting             Bleeding is light             Pain controlled with acetaminophen and ibuprofen (OTC)             Up ad lib / ambulatory / voiding without difficulties   O:  A & O x 3, in no apparent distress              VS:  Vitals:   11/14/20 0047 11/14/20 0120 11/14/20 0235 11/14/20 0635  BP: 125/64 134/69 (!) 144/79 132/74  Pulse: (!) 105 90 79 73  Resp:  18 18 19   Temp:  98.3 F (36.8 C) 97.9 F (36.6 C) 98 F (36.7 C)  TempSrc:  Oral Oral Oral  SpO2:  98% 98% 98%  Weight:      Height:        LABS:  Recent Labs    11/13/20 2146 11/14/20 0558  WBC 11.6* 13.3*  HGB 12.1 9.2*  HCT 36.4 27.7*  PLT 296 243    Blood type: --/--/A POS (08/30 2145)  Rubella:   immune  I&O: I/O last 3 completed shifts: In: 0  Out: 624 [Urine:300; Blood:324]          No intake/output data recorded.  Vaccines: TDaP          UTD                    COVID-19 declined  Gen: AAO x 3, NAD  Abdomen: soft, non-tender, non-distended             Fundus: firm, non-tender, U-1  Perineum: repair intact, no edema  Lochia: small  Extremities: no edema, no calf pain or tenderness    A/P: PPD # 1 30 y.o., 2146   Principal Problem:   Postpartum care following vaginal delivery 8/30 Active Problems:   Maternal anemia, with delivery - ABL   SVD 8/30   Second degree perineal laceration   Anxiety and depression  - stable on Zoloft and BuSpar   Doing well - stable status  Routine post partum orders  Anticipate discharge tomorrow    9/30, MSN, CNM 11/14/2020, 9:07 AM

## 2020-11-15 LAB — BIRTH TISSUE RECOVERY COLLECTION (PLACENTA DONATION)

## 2020-11-15 MED ORDER — POLYSACCHARIDE IRON COMPLEX 150 MG PO CAPS: 150.0000 mg | ORAL_CAPSULE | Freq: Every day | ORAL | Status: AC

## 2020-11-15 MED ORDER — BUSPIRONE HCL 5 MG PO TABS
5.0000 mg | ORAL_TABLET | Freq: Two times a day (BID) | ORAL | Status: DC
  Administered 2020-11-15: 5 mg via ORAL
  Filled 2020-11-15 (×2): qty 1

## 2020-11-15 MED ORDER — MAGNESIUM OXIDE -MG SUPPLEMENT 400 (240 MG) MG PO TABS: 400.0000 mg | ORAL_TABLET | Freq: Every day | ORAL | Status: AC

## 2020-11-15 MED ORDER — BUSPIRONE HCL 10 MG PO TABS: 10.0000 mg | ORAL_TABLET | Freq: Three times a day (TID) | ORAL | Status: DC

## 2020-11-15 MED ORDER — BENZOCAINE-MENTHOL 20-0.5 % EX AERO: 1.0000 "application " | INHALATION_SPRAY | CUTANEOUS | Status: AC | PRN

## 2020-11-15 MED ORDER — IBUPROFEN 600 MG PO TABS: 600.0000 mg | ORAL_TABLET | Freq: Four times a day (QID) | ORAL | 0 refills | Status: AC

## 2020-11-15 MED ORDER — SERTRALINE HCL 25 MG PO TABS
25.0000 mg | ORAL_TABLET | Freq: Every day | ORAL | Status: DC
  Administered 2020-11-15: 25 mg via ORAL
  Filled 2020-11-15: qty 1

## 2020-11-15 MED ORDER — ACETAMINOPHEN 500 MG PO TABS: 1000.0000 mg | ORAL_TABLET | Freq: Four times a day (QID) | ORAL | 2 refills | Status: AC | PRN

## 2020-11-15 NOTE — Discharge Instructions (Signed)
Lactation outpatient support - home visit   Jessica Bowers, IBCLC (lactation consultant)  & Birth Doula  Phone (text or call): [phone] Email: [email or web address]   Linda Coppola RN, MHA, IBCLC at [facility]: Lactation Consultant  [email or web address] Mail: [email or web address] Tel: [phone]    Additional breastfeeding resources:  [facility] https://ibconline.ca/information-sheets/  [employer] [place]  [email or web address]  Chiropractic specialist   Dr. Leanna Hastings [email or web address]   Craniosacral therapy for baby  Erin Balkind  [email or web address]  

## 2020-11-15 NOTE — Discharge Summary (Signed)
OB Discharge Summary  Patient Name: Karen Mcintyre DOB: Nov 26, 1990 MRN: [id number]  Date of admission: 11/13/2020 Delivering provider: Dorisann Frames K   Admitting diagnosis: Normal labor and delivery [O80] Intrauterine pregnancy: [redacted]w[redacted]d     Secondary diagnosis: Patient Active Problem List   Diagnosis Date Noted   SVD 8/30 11/14/2020   Postpartum care following vaginal delivery 8/30 11/14/2020   Second degree perineal laceration 11/14/2020   Anxiety and depression 11/14/2020   Maternal anemia, with delivery - ABL 10/28/2018   Additional problems:none   Date of discharge: 11/15/2020   Discharge diagnosis: Principal Problem:   Postpartum care following vaginal delivery 8/30 Active Problems:   Maternal anemia, with delivery - ABL   SVD 8/30   Second degree perineal laceration   Anxiety and depression                                                              Post partum procedures: none  Augmentation: N/A Pain control: Epidural  Laceration:2nd degree  Episiotomy:None  Complications: None  Hospital course:  Onset of Labor With Vaginal Delivery      30 y.o. yo [id number] at [redacted]w[redacted]d was admitted in Active Labor on 11/13/2020. Patient had an uncomplicated labor course as follows:  Membrane Rupture Time/Date: 10:45 PM ,11/13/2020   Delivery Method:Vaginal, Spontaneous  Episiotomy: None  Lacerations:  2nd degree  Patient had an uncomplicated postpartum course.  She is ambulating, tolerating a regular diet, passing flatus, and urinating well. Patient is discharged home in stable condition on 11/15/20.  Newborn Data: Birth date:11/13/2020  Birth time:11:36 PM  Gender:Female  Living status:Living  Apgars:9 ,9  Weight:3960 g   Physical exam  Vitals:   11/14/20 1018 11/14/20 1418 11/14/20 2050 11/15/20 0538  BP: 120/70 114/68 132/78 129/84  Pulse: (!) 104 87 77   Resp: 18 18 17 16   Temp: 98.2 F (36.8 C) 98.2 F (36.8 C) 98.3 F (36.8 C) 98 F (36.7 C)  TempSrc: Axillary  Oral  Oral  SpO2: 99% 100% 100% 99%  Weight:      Height:       General: alert, cooperative, and no distress Lochia: appropriate Uterine Fundus: firm Incision: N/A Perineum: repair intact, no edema DVT Evaluation: No cords or calf tenderness. No significant calf/ankle edema. Labs: Lab Results  Component Value Date   WBC 13.3 (H) 11/14/2020   HGB 9.2 (L) 11/14/2020   HCT 27.7 (L) 11/14/2020   MCV 83.9 11/14/2020   PLT 243 11/14/2020   CMP Latest Ref Rng & Units 12/29/2018  Glucose 70 - 99 mg/dL 12/31/2018)  BUN 6 - 20 mg/dL 8  Creatinine 527(P - 8.24 mg/dL 2.35  Sodium 3.61 - 443 mmol/L 139  Potassium 3.5 - 5.1 mmol/L 4.0  Chloride 98 - 111 mmol/L 107  CO2 22 - 32 mmol/L 23  Calcium 8.9 - 10.3 mg/dL 8.3(L)  Total Protein 6.5 - 8.1 g/dL -  Total Bilirubin 0.3 - 1.2 mg/dL -  Alkaline Phos 38 - 154 U/L -  AST 15 - 41 U/L -  ALT 0 - 44 U/L -   Edinburgh Postnatal Depression Scale Screening Tool 11/14/2020 10/27/2018 10/26/2018  I have been able to laugh and see the funny side of things. 1 0 (No Data)  I have looked forward  with enjoyment to things. 0 0 -  I have blamed myself unnecessarily when things went wrong. 0 1 -  I have been anxious or worried for no good reason. 0 2 -  I have felt scared or panicky for no good reason. 0 0 -  Things have been getting on top of me. 1 0 -  I have been so unhappy that I have had difficulty sleeping. 0 0 -  I have felt sad or miserable. 1 0 -  I have been so unhappy that I have been crying. 1 0 -  The thought of harming myself has occurred to me. 0 0 -  Edinburgh Postnatal Depression Scale Total 4 3 -   Vaccines: TDaP          UTD        COVID-19   declined  Discharge instruction:  per After Visit Summary,  [facility] booklet and  "Understanding Mother & Baby Care" hospital booklet  After Visit Meds:  Allergies as of 11/15/2020       Reactions   Diphenhydramine Hcl Hives   Nitrofurantoin Other (See Comments)   unknown         Medication List     STOP taking these medications    traMADol 50 MG tablet Commonly known as: ULTRAM       TAKE these medications    acetaminophen 500 MG tablet Commonly known as: TYLENOL Take 2 tablets (1,000 mg total) by mouth every 6 (six) hours as needed.   benzocaine-Menthol 20-0.5 % Aero Commonly known as: DERMOPLAST Apply 1 application topically as needed for irritation (perineal discomfort).   busPIRone 5 MG tablet Commonly known as: BUSPAR Take 5 mg by mouth 2 (two) times daily.   ibuprofen 600 MG tablet Commonly known as: ADVIL Take 1 tablet (600 mg total) by mouth every 6 (six) hours.   iron polysaccharides 150 MG capsule Commonly known as: Ferrex 150 Take 1 capsule (150 mg total) by mouth daily.   magnesium oxide 400 (240 Mg) MG tablet Commonly known as: MAG-OX Take 1 tablet (400 mg total) by mouth daily. For prevention of constipation.   multivitamin-prenatal 27-0.8 MG Tabs tablet Take 1 tablet by mouth daily at 12 noon.   sertraline 25 MG tablet Commonly known as: ZOLOFT Take 25 mg by mouth daily.               Discharge Care Instructions  (From admission, onward)           Start     Ordered   11/15/20 0000  Discharge wound care:       Comments: Sitz baths 2 times /day with warm water x 1 week. May add herbals: 1 ounce dried comfrey leaf* 1 ounce calendula flowers 1 ounce lavender flowers  Supplies can be found online at [employer] sources at [employer], [employer]  1/2 ounce dried uva ursi leaves 1/2 ounce witch hazel blossoms (if you can find them) 1/2 ounce dried sage leaf 1/2 cup sea salt Directions: Bring 2 quarts of water to a boil. Turn off heat, and place 1 ounce (approximately 1 large handful) of the above mixed herbs (not the salt) into the pot. Steep, covered, for 30 minutes.  Strain the liquid well with a fine mesh strainer, and discard the herb material. Add 2 quarts of liquid to the tub, along with  the 1/2 cup of salt. This medicinal liquid can also be made into compresses and peri-rinses.  11/15/20 0926            Diet: iron rich diet  Activity: Advance as tolerated. Pelvic rest for 6 weeks.   Postpartum contraception:  TBD in office  Newborn Data: Live born female  Birth Weight: 8 lb 11.7 oz (3960 g) APGAR: 9, 9  Newborn Delivery   Birth date/time: 11/13/2020 23:36:00 Delivery type: Vaginal, Spontaneous      named Lonni Fix Baby Feeding: Bottle and Breast Disposition:home with mother Circumcision: completed inpatient/Dr. Conni Elliot   Delivery Report:  Review the Delivery Report for details.    Follow up:  Follow-up Information     Olivia Mackie, MD. Schedule an appointment as soon as possible for a visit.   Specialty: Obstetrics and Gynecology Why: For Postpartum follow-up Contact information: [address] [place] [address] [phone]                   Signed: Neta Mends, CNM, MSN 11/15/2020, 9:28 AM

## 2020-11-27 ENCOUNTER — Telehealth (HOSPITAL_COMMUNITY): Payer: Self-pay

## 2020-11-27 NOTE — Telephone Encounter (Signed)
  No answer. Left message to return nurse call.  Marcelino Duster [facility] 11/27/2020,1906

## 2023-04-30 ENCOUNTER — Ambulatory Visit: Payer: BC Managed Care – PPO | Admitting: Dietician
